# Patient Record
Sex: Female | Born: 1990 | Race: White | Hispanic: No | Marital: Single | Smoking: Former smoker
Health system: Southern US, Community
[De-identification: ages and names within clinical notes are randomized; demographics above are authoritative.]

## PROBLEM LIST (undated history)

## (undated) DIAGNOSIS — E669 Obesity, unspecified: Secondary | ICD-10-CM

## (undated) DIAGNOSIS — O24419 Gestational diabetes mellitus in pregnancy, unspecified control: Secondary | ICD-10-CM

## (undated) HISTORY — DX: Obesity, unspecified: E66.9

---
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
## 2011-01-12 LAB — I-STAT 8, (EC8 V) (CONVERTED LAB)
BUN: 11
Bicarbonate: 23.9
HCT: 43
Hemoglobin: 14.6
Operator id: 257131
Sodium: 139
TCO2: 25
pCO2, Ven: 44.2 — ABNORMAL LOW

## 2011-01-12 LAB — POCT I-STAT CREATININE: Creatinine, Ser: 0.7

## 2013-03-27 DIAGNOSIS — O24419 Gestational diabetes mellitus in pregnancy, unspecified control: Secondary | ICD-10-CM

## 2013-03-27 HISTORY — PX: CHOLECYSTECTOMY: SHX55

## 2013-03-27 HISTORY — DX: Gestational diabetes mellitus in pregnancy, unspecified control: O24.419

## 2013-04-24 ENCOUNTER — Ambulatory Visit: Payer: Self-pay | Admitting: Nurse Practitioner

## 2013-04-27 ENCOUNTER — Ambulatory Visit: Payer: Self-pay | Admitting: Nurse Practitioner

## 2013-06-19 ENCOUNTER — Ambulatory Visit: Payer: Self-pay | Admitting: Nurse Practitioner

## 2013-07-25 ENCOUNTER — Ambulatory Visit: Payer: Self-pay | Admitting: Nurse Practitioner

## 2013-08-08 ENCOUNTER — Emergency Department: Payer: Self-pay | Admitting: Emergency Medicine

## 2013-08-08 LAB — CK TOTAL AND CKMB (NOT AT ARMC)
CK, TOTAL: 90 U/L
CK-MB: 1.7 ng/mL (ref 0.5–3.6)

## 2013-08-08 LAB — COMPREHENSIVE METABOLIC PANEL
ALBUMIN: 2.7 g/dL — AB (ref 3.4–5.0)
ANION GAP: 8 (ref 7–16)
AST: 10 U/L — AB (ref 15–37)
Alkaline Phosphatase: 103 U/L
BILIRUBIN TOTAL: 0.4 mg/dL (ref 0.2–1.0)
BUN: 7 mg/dL (ref 7–18)
CHLORIDE: 105 mmol/L (ref 98–107)
CREATININE: 0.51 mg/dL — AB (ref 0.60–1.30)
Calcium, Total: 8.4 mg/dL — ABNORMAL LOW (ref 8.5–10.1)
Co2: 24 mmol/L (ref 21–32)
Glucose: 140 mg/dL — ABNORMAL HIGH (ref 65–99)
OSMOLALITY: 274 (ref 275–301)
Potassium: 3.4 mmol/L — ABNORMAL LOW (ref 3.5–5.1)
SGPT (ALT): 14 U/L (ref 12–78)
Sodium: 137 mmol/L (ref 136–145)
TOTAL PROTEIN: 6.8 g/dL (ref 6.4–8.2)

## 2013-08-08 LAB — CBC
HCT: 31.8 % — ABNORMAL LOW (ref 35.0–47.0)
HGB: 10.9 g/dL — ABNORMAL LOW (ref 12.0–16.0)
MCH: 31.2 pg (ref 26.0–34.0)
MCHC: 34.2 g/dL (ref 32.0–36.0)
MCV: 91 fL (ref 80–100)
PLATELETS: 182 10*3/uL (ref 150–440)
RBC: 3.49 10*6/uL — ABNORMAL LOW (ref 3.80–5.20)
RDW: 14.1 % (ref 11.5–14.5)
WBC: 11 10*3/uL (ref 3.6–11.0)

## 2013-08-08 LAB — TROPONIN I: Troponin-I: <0.02 ng/mL

## 2013-08-08 LAB — TSH: Thyroid Stimulating Horm: 1.25 u[IU]/mL

## 2013-08-25 ENCOUNTER — Ambulatory Visit: Payer: Self-pay | Admitting: Nurse Practitioner

## 2013-09-05 ENCOUNTER — Observation Stay: Payer: Self-pay | Admitting: Obstetrics & Gynecology

## 2013-09-05 LAB — URINALYSIS, COMPLETE
BACTERIA: NONE SEEN
Bilirubin,UR: NEGATIVE
Blood: NEGATIVE
GLUCOSE, UR: NEGATIVE mg/dL (ref 0–75)
Hyaline Cast: 2
KETONE: NEGATIVE
LEUKOCYTE ESTERASE: NEGATIVE
Nitrite: NEGATIVE
PH: 6 (ref 4.5–8.0)
PROTEIN: NEGATIVE
Specific Gravity: 1.015 (ref 1.003–1.030)

## 2013-09-12 ENCOUNTER — Observation Stay: Payer: Self-pay | Admitting: Obstetrics and Gynecology

## 2013-09-19 ENCOUNTER — Observation Stay: Payer: Self-pay | Admitting: Obstetrics and Gynecology

## 2013-09-30 ENCOUNTER — Observation Stay: Payer: Self-pay | Admitting: Obstetrics and Gynecology

## 2013-09-30 LAB — PIH PROFILE
Anion Gap: 7 (ref 7–16)
BUN: 4 mg/dL — AB (ref 7–18)
CALCIUM: 9 mg/dL (ref 8.5–10.1)
CHLORIDE: 106 mmol/L (ref 98–107)
CREATININE: 0.74 mg/dL (ref 0.60–1.30)
Co2: 26 mmol/L (ref 21–32)
EGFR (African American): >60
GLUCOSE: 74 mg/dL (ref 65–99)
HCT: 31.6 % — AB (ref 35.0–47.0)
HGB: 11.1 g/dL — ABNORMAL LOW (ref 12.0–16.0)
MCH: 31.6 pg (ref 26.0–34.0)
MCHC: 35 g/dL (ref 32.0–36.0)
MCV: 91 fL (ref 80–100)
Osmolality: 273 (ref 275–301)
PLATELETS: 122 10*3/uL — AB (ref 150–440)
Potassium: 3.8 mmol/L (ref 3.5–5.1)
RBC: 3.5 10*6/uL — ABNORMAL LOW (ref 3.80–5.20)
RDW: 14.5 % (ref 11.5–14.5)
SGOT(AST): 24 U/L (ref 15–37)
Sodium: 139 mmol/L (ref 136–145)
Uric Acid: 5.3 mg/dL (ref 2.6–6.0)
WBC: 9.6 10*3/uL (ref 3.6–11.0)

## 2013-09-30 LAB — PROTEIN / CREATININE RATIO, URINE
Creatinine, Urine: 26.3 mg/dL — ABNORMAL LOW (ref 30.0–125.0)
PROTEIN, RANDOM URINE: 6 mg/dL (ref 0–12)
Protein/Creat. Ratio: 228 mg/gCREAT — ABNORMAL HIGH (ref 0–200)

## 2013-10-02 ENCOUNTER — Observation Stay: Payer: Self-pay | Admitting: Obstetrics and Gynecology

## 2013-10-02 LAB — PROTEIN / CREATININE RATIO, URINE
CREATININE, URINE: 46.7 mg/dL (ref 30.0–125.0)
PROTEIN, RANDOM URINE: 19 mg/dL — AB (ref 0–12)
Protein/Creat. Ratio: 407 mg/gCREAT — ABNORMAL HIGH (ref 0–200)

## 2013-10-03 ENCOUNTER — Inpatient Hospital Stay: Payer: Self-pay

## 2013-10-03 LAB — CBC WITH DIFFERENTIAL/PLATELET
BASOS ABS: 0.1 10*3/uL (ref 0.0–0.1)
Basophil %: 0.3 %
EOS ABS: 0 10*3/uL (ref 0.0–0.7)
EOS PCT: 0.1 %
HCT: 32.1 % — ABNORMAL LOW (ref 35.0–47.0)
HGB: 10.9 g/dL — ABNORMAL LOW (ref 12.0–16.0)
LYMPHS PCT: 7.1 %
Lymphocyte #: 1.3 10*3/uL (ref 1.0–3.6)
MCH: 30.9 pg (ref 26.0–34.0)
MCHC: 34 g/dL (ref 32.0–36.0)
MCV: 91 fL (ref 80–100)
MONOS PCT: 7.8 %
Monocyte #: 1.4 x10 3/mm — ABNORMAL HIGH (ref 0.2–0.9)
NEUTROS ABS: 15 10*3/uL — AB (ref 1.4–6.5)
NEUTROS PCT: 84.7 %
PLATELETS: 143 10*3/uL — AB (ref 150–440)
RBC: 3.54 10*6/uL — AB (ref 3.80–5.20)
RDW: 14.8 % — ABNORMAL HIGH (ref 11.5–14.5)
WBC: 17.7 10*3/uL — ABNORMAL HIGH (ref 3.6–11.0)

## 2013-10-04 LAB — HEMATOCRIT: HCT: 26.7 % — ABNORMAL LOW (ref 35.0–47.0)

## 2013-10-11 ENCOUNTER — Emergency Department: Payer: Self-pay | Admitting: Emergency Medicine

## 2013-10-11 LAB — COMPREHENSIVE METABOLIC PANEL
ALBUMIN: 2.9 g/dL — AB (ref 3.4–5.0)
ALK PHOS: 134 U/L — AB
ALT: 26 U/L (ref 12–78)
ANION GAP: 11 (ref 7–16)
BILIRUBIN TOTAL: 0.6 mg/dL (ref 0.2–1.0)
BUN: 9 mg/dL (ref 7–18)
CHLORIDE: 105 mmol/L (ref 98–107)
CREATININE: 0.96 mg/dL (ref 0.60–1.30)
Calcium, Total: 8.9 mg/dL (ref 8.5–10.1)
Co2: 22 mmol/L (ref 21–32)
EGFR (Non-African Amer.): >60
Glucose: 125 mg/dL — ABNORMAL HIGH (ref 65–99)
Osmolality: 276 (ref 275–301)
POTASSIUM: 3.7 mmol/L (ref 3.5–5.1)
SGOT(AST): 24 U/L (ref 15–37)
SODIUM: 138 mmol/L (ref 136–145)
Total Protein: 7.4 g/dL (ref 6.4–8.2)

## 2013-10-11 LAB — URINALYSIS, COMPLETE
BILIRUBIN, UR: NEGATIVE
GLUCOSE, UR: NEGATIVE mg/dL (ref 0–75)
Ketone: NEGATIVE
Nitrite: NEGATIVE
PH: 6 (ref 4.5–8.0)
Protein: 30
Specific Gravity: 1.005 (ref 1.003–1.030)
Squamous Epithelial: 10
WBC UR: 262 /HPF (ref 0–5)

## 2013-10-11 LAB — CBC WITH DIFFERENTIAL/PLATELET
Basophil #: 0.1 10*3/uL (ref 0.0–0.1)
Basophil %: 0.5 %
EOS ABS: 0.1 10*3/uL (ref 0.0–0.7)
Eosinophil %: 1 %
HCT: 31.4 % — ABNORMAL LOW (ref 35.0–47.0)
HGB: 10.5 g/dL — AB (ref 12.0–16.0)
Lymphocyte #: 1.5 10*3/uL (ref 1.0–3.6)
Lymphocyte %: 12.2 %
MCH: 30.7 pg (ref 26.0–34.0)
MCHC: 33.4 g/dL (ref 32.0–36.0)
MCV: 92 fL (ref 80–100)
MONO ABS: 0.7 x10 3/mm (ref 0.2–0.9)
Monocyte %: 5.9 %
NEUTROS PCT: 80.4 %
Neutrophil #: 10 10*3/uL — ABNORMAL HIGH (ref 1.4–6.5)
PLATELETS: 266 10*3/uL (ref 150–440)
RBC: 3.42 10*6/uL — AB (ref 3.80–5.20)
RDW: 15 % — ABNORMAL HIGH (ref 11.5–14.5)
WBC: 12.4 10*3/uL — ABNORMAL HIGH (ref 3.6–11.0)

## 2013-10-11 LAB — WET PREP, GENITAL

## 2013-10-11 LAB — LIPASE, BLOOD: LIPASE: 75 U/L (ref 73–393)

## 2013-10-11 LAB — GC/CHLAMYDIA PROBE AMP

## 2013-10-11 LAB — HCG, QUANTITATIVE, PREGNANCY: BETA HCG, QUANT.: 109 m[IU]/mL — AB

## 2013-10-13 ENCOUNTER — Inpatient Hospital Stay: Payer: Self-pay | Admitting: Obstetrics and Gynecology

## 2013-10-13 LAB — COMPREHENSIVE METABOLIC PANEL
ALBUMIN: 2.8 g/dL — AB (ref 3.4–5.0)
ALK PHOS: 129 U/L — AB
Anion Gap: 9 (ref 7–16)
BUN: 9 mg/dL (ref 7–18)
Bilirubin,Total: 0.7 mg/dL (ref 0.2–1.0)
CHLORIDE: 104 mmol/L (ref 98–107)
CO2: 25 mmol/L (ref 21–32)
Calcium, Total: 8.6 mg/dL (ref 8.5–10.1)
Creatinine: 0.86 mg/dL (ref 0.60–1.30)
EGFR (African American): >60
EGFR (Non-African Amer.): >60
GLUCOSE: 96 mg/dL (ref 65–99)
Osmolality: 274 (ref 275–301)
Potassium: 3.5 mmol/L (ref 3.5–5.1)
SGOT(AST): 19 U/L (ref 15–37)
SGPT (ALT): 20 U/L (ref 12–78)
Sodium: 138 mmol/L (ref 136–145)
Total Protein: 7.3 g/dL (ref 6.4–8.2)

## 2013-10-13 LAB — CBC WITH DIFFERENTIAL/PLATELET
BASOS PCT: 0.2 %
Basophil #: 0 10*3/uL (ref 0.0–0.1)
EOS ABS: 0 10*3/uL (ref 0.0–0.7)
Eosinophil %: 0.1 %
HCT: 29.8 % — ABNORMAL LOW (ref 35.0–47.0)
HGB: 10.2 g/dL — AB (ref 12.0–16.0)
LYMPHS ABS: 1.2 10*3/uL (ref 1.0–3.6)
Lymphocyte %: 7.3 %
MCH: 31 pg (ref 26.0–34.0)
MCHC: 34.2 g/dL (ref 32.0–36.0)
MCV: 91 fL (ref 80–100)
MONO ABS: 1.2 x10 3/mm — AB (ref 0.2–0.9)
MONOS PCT: 7.7 %
NEUTROS ABS: 13.6 10*3/uL — AB (ref 1.4–6.5)
NEUTROS PCT: 84.7 %
Platelet: 229 10*3/uL (ref 150–440)
RBC: 3.28 10*6/uL — AB (ref 3.80–5.20)
RDW: 14.7 % — AB (ref 11.5–14.5)
WBC: 16.1 10*3/uL — AB (ref 3.6–11.0)

## 2013-10-14 LAB — URINE CULTURE

## 2013-10-16 LAB — CBC WITH DIFFERENTIAL/PLATELET
Basophil #: 0 10*3/uL (ref 0.0–0.1)
Basophil %: 0.3 %
Eosinophil #: 0.1 10*3/uL (ref 0.0–0.7)
Eosinophil %: 1.4 %
HCT: 28.3 % — AB (ref 35.0–47.0)
HGB: 9.6 g/dL — ABNORMAL LOW (ref 12.0–16.0)
LYMPHS ABS: 1.8 10*3/uL (ref 1.0–3.6)
LYMPHS PCT: 24.2 %
MCH: 30.7 pg (ref 26.0–34.0)
MCHC: 33.8 g/dL (ref 32.0–36.0)
MCV: 91 fL (ref 80–100)
MONO ABS: 0.6 x10 3/mm (ref 0.2–0.9)
MONOS PCT: 8.2 %
NEUTROS ABS: 4.8 10*3/uL (ref 1.4–6.5)
NEUTROS PCT: 65.9 %
Platelet: 273 10*3/uL (ref 150–440)
RBC: 3.12 10*6/uL — ABNORMAL LOW (ref 3.80–5.20)
RDW: 14.2 % (ref 11.5–14.5)
WBC: 7.3 10*3/uL (ref 3.6–11.0)

## 2013-10-16 LAB — URINALYSIS, COMPLETE
BILIRUBIN, UR: NEGATIVE
GLUCOSE, UR: NEGATIVE mg/dL (ref 0–75)
Ketone: NEGATIVE
NITRITE: NEGATIVE
Ph: 6 (ref 4.5–8.0)
Protein: NEGATIVE
Specific Gravity: 1.003 (ref 1.003–1.030)
Squamous Epithelial: 1

## 2013-10-18 LAB — CULTURE, BLOOD (SINGLE)

## 2013-10-25 ENCOUNTER — Emergency Department: Payer: Self-pay | Admitting: Emergency Medicine

## 2013-10-25 LAB — URINALYSIS, COMPLETE
Bacteria: NONE SEEN
Bilirubin,UR: NEGATIVE
Glucose,UR: NEGATIVE mg/dL (ref 0–75)
Ketone: NEGATIVE
NITRITE: NEGATIVE
PH: 5 (ref 4.5–8.0)
PROTEIN: NEGATIVE
Specific Gravity: 1.016 (ref 1.003–1.030)

## 2013-10-25 LAB — COMPREHENSIVE METABOLIC PANEL
ALBUMIN: 3.5 g/dL (ref 3.4–5.0)
ALT: 23 U/L
Alkaline Phosphatase: 132 U/L — ABNORMAL HIGH
Anion Gap: 9 (ref 7–16)
BILIRUBIN TOTAL: 0.4 mg/dL (ref 0.2–1.0)
BUN: 15 mg/dL (ref 7–18)
CALCIUM: 9.1 mg/dL (ref 8.5–10.1)
CHLORIDE: 105 mmol/L (ref 98–107)
CREATININE: 1.08 mg/dL (ref 0.60–1.30)
Co2: 26 mmol/L (ref 21–32)
EGFR (African American): >60
GLUCOSE: 104 mg/dL — AB (ref 65–99)
OSMOLALITY: 281 (ref 275–301)
POTASSIUM: 4.6 mmol/L (ref 3.5–5.1)
SGOT(AST): 16 U/L (ref 15–37)
SODIUM: 140 mmol/L (ref 136–145)
Total Protein: 7.7 g/dL (ref 6.4–8.2)

## 2013-10-25 LAB — CBC
HCT: 33.3 % — AB (ref 35.0–47.0)
HGB: 10.9 g/dL — ABNORMAL LOW (ref 12.0–16.0)
MCH: 29.8 pg (ref 26.0–34.0)
MCHC: 32.8 g/dL (ref 32.0–36.0)
MCV: 91 fL (ref 80–100)
Platelet: 319 10*3/uL (ref 150–440)
RBC: 3.67 10*6/uL — ABNORMAL LOW (ref 3.80–5.20)
RDW: 14.7 % — AB (ref 11.5–14.5)
WBC: 8.4 10*3/uL (ref 3.6–11.0)

## 2013-10-25 LAB — TROPONIN I

## 2013-10-25 LAB — PRO B NATRIURETIC PEPTIDE: B-Type Natriuretic Peptide: 34 pg/mL (ref 0–125)

## 2013-10-25 LAB — LIPASE, BLOOD: Lipase: 123 U/L (ref 73–393)

## 2013-10-28 LAB — PATHOLOGY REPORT

## 2013-11-18 ENCOUNTER — Ambulatory Visit: Payer: Self-pay | Admitting: Surgery

## 2013-11-20 LAB — PATHOLOGY REPORT

## 2014-07-18 NOTE — Op Note (Signed)
PATIENT NAME:  Meagan ErichsenCHAMPION, Meagan MR#:  [id number] DATE OF BIRTH:  1990-11-13  DATE OF PROCEDURE:  11/18/2013  PREOPERATIVE DIAGNOSIS: Chronic cholecystitis and cholelithiasis.   POSTOPERATIVE DIAGNOSIS: Chronic cholecystitis and cholelithiasis.   OPERATION: Robotic-assisted laparoscopic cholecystectomy.   SURGEON: Quentin Orealph L. Ely III, MD.   ANESTHESIA: General.   OPERATIVE PROCEDURE: With the patient in the supine position after induction of appropriate general anesthesia, the patient's abdomen was prepped with ChloraPrep and draped with sterile towels. The patient was placed in the head down, feet up position. A small infraumbilical incision was made in the standard fashion and carried down bluntly through the subcutaneous tissue. A Veress needle could not reach the abdominal cavity. Using an Optiview bariatric catheter under direct vision, the abdomen was cannulated without difficulty. CO2 was insufflated. Two lateral ports, robotic size, were placed under direct vision, and then a 5 mm port was placed in the right upper quadrant. The patient was placed in the appropriate position. The robot brought to the table, docked to the patient, instruments inserted under direct vision. I then moved to the console. The gallbladder was grasped and retracted superiorly and laterally, exposing the hepatoduodenal ligament. The cystic duct and common duct were easily identified. A large lymph node was visualized along the cystic duct. Dissection was carried out along the hepatoduodenal ligament exposing the cystic duct. Once satisfactory exposure was obtained, the duct was doubly clipped and divided. The cystic artery was behind the duct, visualized clean, then doubly clipped and divided. There was a small bleeding source on top of the artery which was cauterized. The gallbladder was then dissected free of its bed and liver using hook and cautery apparatus. Once the gallbladder was free, the area was suction irrigated.  There did not appear to be any significant bleeding sites anywhere in the bed of the liver. The gallbladder was captured with a grasper and removed through the umbilical port. The area was then desufflated. All ports were withdrawn without difficulty. The area was infiltrated with 0.25% Marcaine for postoperative pain control. Sterile dressings were applied. The patient was returned to the recovery room, having tolerated the procedure well. Sponge and needle counts were correct x 2 in the operating room.    ____________________________ Carmie Endalph L. Ely III, MD rle:at D: 11/18/2013 09:13:29 ET T: 11/18/2013 15:00:50 ET JOB#: [id number]  cc: Quentin Orealph L. Ely III, MD, <Dictator> Quentin OreALPH L ELY MD ELECTRONICALLY SIGNED 11/20/2013 17:28

## 2014-08-04 NOTE — H&P (Signed)
L&D Evaluation:  History Expanded:  HPI [age] yo G2P1 twins at 3165w1d [facility]. No vaginal bleeding or Ctxs. positive fetal movement x 2. Presents for scheduled NST of di/di twin pregnancy   Gravida 2   Term 1   PreTerm 0   Abortion 0   Living 1   Blood Type (Maternal) O positive   Group B Strep Results Maternal (Result >5wks must be treated as unknown) unknown/result > 5 weeks ago   Maternal HIV Negative   Maternal Syphilis Ab Nonreactive   Maternal Varicella Immune   Rubella Results (Maternal) immune   Maternal T-Dap Unknown   [facility]   Patient's Medical History No Chronic Illness   Patient's Surgical History none   Medications Pre Natal Vitamins   Allergies PCN   Social History none   Family History Non-Contributory   Current Prenatal Course Notable For Multiple Gestation   ROS:  ROS All systems were reviewed.  HEENT, CNS, GI, GU, Respiratory, CV, Renal and Musculoskeletal systems were found to be normal.   Exam:  Vital Signs normotensive, all other vss stable   General no apparent distress   Abdomen gravid, non-tender   FHT normal rate with no decels   FHT Description Baby A: 140/mod var/+accels/no decels, Baby B: 145/mod var/+accels/no decels   Ucx absent   Impression:  Impression 1) Intrauterine pregnancy with di/di twins at 1965w1d gestational age , 2) Reactive NST   Plan:  Plan EFM/NST   Comments twin pregnancy   Follow Up Appointment need to schedule. in 1 week   Electronic Signatures: Conard NovakJackson, Stephen D (MD)  (Signed 26-Jun-15 17:40)  Authored: L&D Evaluation   Last Updated: 26-Jun-15 17:40 by Conard NovakJackson, Stephen D (MD)

## 2014-08-04 NOTE — H&P (Signed)
L&D Evaluation:  History Expanded:  HPI 24 yo G2P1 twins at 937w3d [facility]. No vaginal bleeding or Ctxs.   Gravida 2   Term 1   PreTerm 0   Abortion 0   Living 11   Blood Type (Maternal) pending   Group B Strep Results Maternal (Result >5wks must be treated as unknown) unknown/result > 5 weeks ago   Maternal HIV Unknown   Maternal Syphilis Ab Unknown   Maternal Varicella Unknown   Rubella Results (Maternal) unknown   Maternal T-Dap Unknown   [facility]   Presents with abdominal pain   Patient's Medical History No Chronic Illness   Patient's Surgical History none   Medications Pre Natal Vitamins   Allergies PCN   Social History none   Family History Non-Contributory   Current Prenatal Course Notable For Multiple Gestation   ROS:  ROS All systems were reviewed.  HEENT, CNS, GI, GU, Respiratory, CV, Renal and Musculoskeletal systems were found to be normal.   Exam:  Vital Signs stable   General no apparent distress   Mental Status clear   Chest clear   Heart normal sinus rhythm   Abdomen gravid, non-tender   Estimated Fetal Weight Average for gestational age   Back no CVAT   Edema no edema   Pelvic no external lesions, cervix closed and thick   Mebranes Intact   FHT normal rate with no decels, x2, Cat 1 x 2   Ucx irregular, absent   Skin dry   Lymph no lymphadenopathy   Impression:  Impression UTI, Dehydration, pain.   Plan:  Plan UA, EFM/NST, monitor contractions and for cervical change   Comments twin pregnancy   Follow Up Appointment need to schedule. in 1 week   Electronic Signatures: Adria DevonKlett, Carrie (MD)  (Signed 19-Jun-15 23:28)  Authored: L&D Evaluation   Last Updated: 19-Jun-15 23:28 by Adria DevonKlett, Carrie (MD)

## 2014-08-04 NOTE — H&P (Signed)
L&D Evaluation:  History Expanded:  HPI 24 yo G2P1 twins at 36w 0days [facility]. Presents with regular uterine contractions. No vaginal bleeding. positive fetal movement x 2. Had some elevetaed BP a couple of days ago and continues to have some today.  Denies headache, visual disturbances, and RUQ pain.   Pregnancy complicated by obesity (BMI 38 initially).   Gravida 2   Term 1   PreTerm 0   Abortion 0   Living 1   Blood Type (Maternal) O positive   Group B Strep Results Maternal (Result >5wks must be treated as unknown) negative   Maternal HIV Negative   Maternal Syphilis Ab Nonreactive   Maternal Varicella Immune   Rubella Results (Maternal) immune   Maternal T-Dap Unknown   [facility]   Patient's Medical History No Chronic Illness   Patient's Surgical History none   Medications Pre Natal Vitamins   Allergies PCN   Social History none   Family History Non-Contributory   Current Prenatal Course Notable For Multiple Gestation   ROS:  ROS All systems were reviewed.  HEENT, CNS, GI, GU, Respiratory, CV, Renal and Musculoskeletal systems were found to be normal.   Exam:  Vital Signs BP >140/90  BP 119-156/65-92, P90s   General no apparent distress   Mental Status clear   Chest clear   Heart normal sinus rhythm   Abdomen gravid, non-tender   Estimated Fetal Weight Large for gestational age, both twins appear appropriatel grown   Fetal Position cephalic (A), transverse (B)   Back no CVAT   Edema 1+  mild pitting on pannus   Reflexes 1+   Pelvic no external lesions, 3cm x 3 checks   Mebranes Intact   FHT normal rate with no decels, cat 1 x  2   FHT Description Baby A: 140/mod var/+accels/no decels, Baby B: 145/mod var/+accels/no decels   Ucx irregular, 2-3 q 10 min   Skin dry   Lymph no lymphadenopathy   Impression:  Impression early labor, evaluation for PIH, 1) Intrauterine pregnancy with  di/di twins at 36w 0 day  gestational age , 2) preeclampsia without severe features, 3) preterm contractions   Plan:  Plan EFM/NST   Comments 1) Monitor for cervical change 2) Discussed that if she does labor, will offer vaginal delivery unless there appears to be distress of one of the twins.  Discussed possibility of vaginal delivery and need for cesarean delivery in addition to vaginal delivery.  Patient understands risks.  Will continue to discuss.   3) a single dose of terbutaline to see if she has relief of symptoms 4) I have discussed with one of my associates, Dr Janene HarveyKlett.  She will come in to assist  with vaginal delivery of twins given non-vertex presentation of baby B. 5) Mild preeclampsia: plan for delivery at 37 weeks unless labors 6) Fetal well being is reassuring x 2 with category 1 tracings   Follow Up Appointment already scheduled. in 1 week   Labs:  Lab Results:  Misc Urine Chem:  09-Jul-15 20:28   Protein/Creat Ratio (comp)  407 (Result(s) reported on 02 Oct 2013 at 10:21PM.)   Electronic Signatures: Conard NovakJackson, Avi Archuleta D (MD)  (Signed 09-Jul-15 23:50)  Authored: L&D Evaluation, Labs   Last Updated: 09-Jul-15 23:50 by Conard NovakJackson, Jessenia Filippone D (MD)

## 2014-08-04 NOTE — H&P (Signed)
L&D Evaluation:  History Expanded:  HPI 24 yo G2P1 twins at 35w 5days [facility]. No vaginal bleeding or Ctxs. positive fetal movement x 2. Presents for scheduled NST of di/di twin pregnancy  also has some elevetaed BP today and is with some mild pitting edema as well as som eelevted pressures, to 144/76 no other PIH symptoms,   Gravida 2   Term 1   PreTerm 0   Abortion 0   Living 1   Blood Type (Maternal) O positive   Group B Strep Results Maternal (Result >5wks must be treated as unknown) negative   Maternal HIV Negative   Maternal Syphilis Ab Nonreactive   Maternal Varicella Immune   Rubella Results (Maternal) immune   Maternal T-Dap Unknown   [facility]   Patient's Medical History No Chronic Illness   Patient's Surgical History none   Medications Pre Natal Vitamins   Allergies PCN   Social History none   Family History Non-Contributory   Current Prenatal Course Notable For Multiple Gestation   ROS:  ROS All systems were reviewed.  HEENT, CNS, GI, GU, Respiratory, CV, Renal and Musculoskeletal systems were found to be normal.   Exam:  Vital Signs BP >140/90  one   General no apparent distress   Mental Status clear   Chest clear   Heart normal sinus rhythm   Abdomen gravid, non-tender   Estimated Fetal Weight Large for gestational age, but twins   Back no CVAT   Edema 1+   Reflexes 1+   Pelvic no external lesions   Mebranes Intact   FHT normal rate with no decels, cat 1 x  2   FHT Description Baby A: 140/mod var/+accels/no decels, Baby B: 145/mod var/+accels/no decels   Ucx irregular   Skin dry   Lymph no lymphadenopathy   Impression:  Impression early labor, evaluation for PIH, 1) Intrauterine pregnancy with di/di twins at 6735w 5 day  gestational age , 2) Reactive NST   Plan:  Plan EFM/NST   Comments twin pregnancy   Follow Up Appointment need to schedule. in 1 week. NST labor  precaiutions given   Electronic Signatures: Adria DevonKlett, Cleven Jansma (MD)  (Signed 07-Jul-15 11:22)  Authored: L&D Evaluation   Last Updated: 07-Jul-15 11:22 by Adria DevonKlett, Kallee Nam (MD)

## 2014-08-04 NOTE — H&P (Signed)
L&D Evaluation:  History Expanded:  HPI 24 yo G2P1 twins at 36w 1days [facility]. Presents with regular uterine contractions. No vaginal bleeding. positive fetal movement x 2. Denies headache, visual disturbances, and RUQ pain.  Elevated BPs intermittantly of late. Pregnancy complicated by obesity (BMI 38 initially).   Gravida 2   Term 1   PreTerm 0   Abortion 0   Living 1   Blood Type (Maternal) O positive   Group B Strep Results Maternal (Result >5wks must be treated as unknown) negative   Maternal HIV Negative   Maternal Syphilis Ab Nonreactive   Maternal Varicella Immune   Rubella Results (Maternal) immune   Maternal T-Dap Unknown   [facility]   Patient's Medical History No Chronic Illness   Patient's Surgical History none   Medications Pre Natal Vitamins   Allergies PCN   Social History none   Family History Non-Contributory   Current Prenatal Course Notable For Multiple Gestation   ROS:  ROS All systems were reviewed.  HEENT, CNS, GI, GU, Respiratory, CV, Renal and Musculoskeletal systems were found to be normal.   Exam:  Vital Signs BP >140/90   General no apparent distress   Mental Status clear   Chest clear   Heart normal sinus rhythm   Abdomen gravid, non-tender   Estimated Fetal Weight Average for gestational age, both twins appear appropriatel grown   Fetal Position cephalic (A), transverse (B)   Back no CVAT   Edema 1+  mild pitting on pannus   Reflexes 1+   Pelvic no external lesions, 7/90/0, BBOW   Mebranes Intact   FHT normal rate with no decels, cat 1   Ucx regular   Ucx Frequency 3 min   Skin dry   Impression:  Impression active labor, 1) Intrauterine pregnancy with di/di twins at 8836w 1 day  gestational age , 2) preterm contractions   Plan:  Plan EFM/NST, monitor contractions and for cervical change, monitor BP, fluids   Comments 1) Monitor for cervical change 2)  Vaginal delivery an option unless there appears to be distress of one of the twins.  Discussed possibility of vaginal delivery and need for cesarean delivery in addition to vaginal delivery.  Patient understands risks.  Will continue to discuss.   3) Fetal well being is reassuring x 2 with category 1 tracings   Electronic Signatures: Letitia LibraHarris, Kazuto Sevey Paul (MD)  (Signed 10-Jul-15 20:41)  Authored: L&D Evaluation   Last Updated: 10-Jul-15 20:41 by Letitia LibraHarris, Jaskirat Schwieger Paul (MD)

## 2014-08-04 NOTE — H&P (Signed)
L&D Evaluation:  History Expanded:  HPI 24 yo G2P1 twins at 32 weeks w LBP and crampiness. [facility]. No vaginal bleeding or Ctxs.  Pain is low back and worse w activity.   Patient's Medical History No Chronic Illness   Patient's Surgical History none   Medications Pre Natal Vitamins   Allergies PCN   Social History none   Family History Non-Contributory   ROS:  ROS All systems were reviewed.  HEENT, CNS, GI, GU, Respiratory, CV, Renal and Musculoskeletal systems were found to be normal.   Exam:  Vital Signs stable   General no apparent distress   Mental Status clear   Chest clear   Heart normal sinus rhythm   Abdomen gravid, non-tender   Estimated Fetal Weight Average for gestational age   Back no CVAT   Edema no edema   Pelvic no external lesions, cervix closed and thick   Mebranes Intact   FHT normal rate with no decels, x2   Ucx absent   Impression:  Impression UTI, Dehydration, pain.   Plan:  Plan UA, EFM/NST, monitor contractions and for cervical change   Comments fetal fibronectin obtained bu w no CTxs or dilating cervix will not send.   Electronic Signatures: Letitia LibraHarris, Robert Paul (MD)  (Signed 12-Jun-15 13:57)  Authored: L&D Evaluation   Last Updated: 12-Jun-15 13:57 by Letitia LibraHarris, Robert Paul (MD)

## 2016-04-08 ENCOUNTER — Encounter: Payer: Self-pay | Admitting: Emergency Medicine

## 2016-04-08 ENCOUNTER — Emergency Department
Admission: EM | Admit: 2016-04-08 | Discharge: 2016-04-08 | Disposition: A | Discharge status: Home / Self Care | Payer: Self-pay | Attending: Emergency Medicine | Admitting: Emergency Medicine

## 2016-04-08 DIAGNOSIS — B9789 Other viral agents as the cause of diseases classified elsewhere: Secondary | ICD-10-CM

## 2016-04-08 DIAGNOSIS — J069 Acute upper respiratory infection, unspecified: Secondary | ICD-10-CM | POA: Insufficient documentation

## 2016-04-08 MED ORDER — FLUTICASONE PROPIONATE 50 MCG/ACT NA SUSP: 1.0000 | Freq: Two times a day (BID) | NASAL | 0 refills | Status: DC

## 2016-04-08 MED ORDER — PSEUDOEPH-BROMPHEN-DM 30-2-10 MG/5ML PO SYRP: 10.0000 mL | ORAL_SOLUTION | Freq: Four times a day (QID) | ORAL | 0 refills | Status: DC | PRN

## 2016-04-08 MED ORDER — CETIRIZINE HCL 10 MG PO TABS: 10.0000 mg | ORAL_TABLET | Freq: Every day | ORAL | 0 refills | Status: DC

## 2016-04-08 NOTE — ED Provider Notes (Signed)
[facility] Emergency Department Provider Note  ____________________________________________  Time seen: Approximately 3:03 PM  I have reviewed the triage vital signs and the nursing notes.   HISTORY  Chief Complaint Nasal Congestion and Fever    HPI Meagan Armstrong is a 26 y.o. female who presents emergency department complaining of nasal congestion, sore throat, coughing, fever. Per the patient, symptoms began gradually. Patient started off nasal congestion then developed sore throat and coughing. Today patient started running a fever. Patient states that she has young children at home and just wants to make sure she is not contagious. Patient has tried pseudoephedrine and Alka-Seltzer with some relief of symptoms. Patient denies any headache, visual changes, neck pain, chest pain, shortness of breath, abdominal pain, nausea or vomiting, diarrhea or constipation.   History reviewed. No pertinent past medical history.  There are no active problems to display for this patient.   History reviewed. No pertinent surgical history.  Prior to Admission medications   Medication Sig Start Date End Date Taking? Authorizing Provider  brompheniramine-pseudoephedrine-DM 30-2-10 MG/5ML syrup Take 10 mLs by mouth 4 (four) times daily as needed. 04/08/16   Delorise RoyalsJonathan D Lenoard Helbert, PA-C  cetirizine (ZYRTEC) 10 MG tablet Take 1 tablet (10 mg total) by mouth daily. 04/08/16   Delorise RoyalsJonathan D Brianni Manthe, PA-C  fluticasone (FLONASE) 50 MCG/ACT nasal spray Place 1 spray into both nostrils 2 (two) times daily. 04/08/16   Delorise RoyalsJonathan D Delbert Darley, PA-C    Allergies Amoxicillin and Penicillins  No family history on file.  Social History Social History  Substance Use Topics  . Smoking status: Never Smoker  . Smokeless tobacco: Never Used  . Alcohol use Yes     Comment: occasionally     Review of Systems  Constitutional: Positive fever/chills Eyes: No visual changes. No  discharge ENT: Positive for nasal congestion and sore throat Cardiovascular: no chest pain. Respiratory: Positive cough. No SOB. Gastrointestinal: No abdominal pain.  No nausea, no vomiting.  No diarrhea.  No constipation. Musculoskeletal: Negative for musculoskeletal pain. Skin: Negative for rash, abrasions, lacerations, ecchymosis. Neurological: Negative for headaches, focal weakness or numbness. 10-point ROS otherwise negative.  ____________________________________________   PHYSICAL EXAM:  VITAL SIGNS: ED Triage Vitals [04/08/16 1422]  Enc Vitals Group     BP 136/83     Pulse Rate 98     Resp 18     Temp 99.1 F (37.3 C)     Temp Source Oral     SpO2 97 %     Weight 247 lb (112 kg)     Height 5\' 6"  (1.676 m)     Head Circumference      Peak Flow      Pain Score 5     Pain Loc      Pain Edu?      Excl. in GC?      Constitutional: Alert and oriented. Well appearing and in no acute distress. Eyes: Conjunctivae are normal. PERRL. EOMI. Head: Atraumatic. ENT:      Ears: EACs with cerumen in bilateral canals. TMs are visualized with no duskiness, bulging, air-fluid level identified.      Nose: Moderate clear congestion/rhinnorhea. Parents are erythematous and edematous. Patient is nontender to palpation/percussion over the sinuses.      Mouth/Throat: Mucous membranes are moist. Oropharynx is mildly erythematous but nonedematous. Uvula is midline. Tonsils are mildly erythematous but nonedematous and no exudates. Neck: No stridor.  No cervical spine tenderness to palpation. Neck supple full range of motion  Hematological/Lymphatic/Immunilogical: No cervical lymphadenopathy. Cardiovascular: Normal rate, regular rhythm. Normal S1 and S2.  Good peripheral circulation. Respiratory: Normal respiratory effort without tachypnea or retractions. Lungs CTAB. Good air entry to the bases with no decreased or absent breath sounds. Musculoskeletal: Full range of motion to all extremities.  No gross deformities appreciated. Neurologic:  Normal speech and language. No gross focal neurologic deficits are appreciated.  Skin:  Skin is warm, dry and intact. No rash noted. Psychiatric: Mood and affect are normal. Speech and behavior are normal. Patient exhibits appropriate insight and judgement.   ____________________________________________   LABS (all labs ordered are listed, but only abnormal results are displayed)  Labs Reviewed - No data to display ____________________________________________  EKG   ____________________________________________  RADIOLOGY   No results found.  ____________________________________________    PROCEDURES  Procedure(s) performed:    Procedures    Medications - No data to display   ____________________________________________   INITIAL IMPRESSION / ASSESSMENT AND PLAN / ED COURSE  Pertinent labs & imaging results that were available during my care of the patient were reviewed by me and considered in my medical decision making (see chart for details).  Review of the [place] CSRS was performed in accordance of the [facility] prior to dispensing any controlled drugs.  Clinical Course     Patient's diagnosis is consistent with Viral upper respiratory infection with cough. Patient's symptoms were insidious in 3 days. No indication for flu swab this time. No indication for further imaging or labs at this time.. Patient will be discharged home with prescriptions for symptom control medications include Flonase, Zyrtec, Bromfed cough syrup. Patient is to follow up with primary care as needed or otherwise directed. Patient is given ED precautions to return to the ED for any worsening or new symptoms.     ____________________________________________  FINAL CLINICAL IMPRESSION(S) / ED DIAGNOSES  Final diagnoses:  Viral URI with cough      NEW MEDICATIONS STARTED DURING THIS VISIT:  Discharge Medication List as of 04/08/2016  3:12  PM    START taking these medications   Details  brompheniramine-pseudoephedrine-DM 30-2-10 MG/5ML syrup Take 10 mLs by mouth 4 (four) times daily as needed., Starting Sat 04/08/2016, Print    cetirizine (ZYRTEC) 10 MG tablet Take 1 tablet (10 mg total) by mouth daily., Starting Sat 04/08/2016, Print    fluticasone (FLONASE) 50 MCG/ACT nasal spray Place 1 spray into both nostrils 2 (two) times daily., Starting Sat 04/08/2016, Print            This chart was dictated using voice recognition software/Dragon. Despite best efforts to proofread, errors can occur which can change the meaning. Any change was purely unintentional.    Racheal Patches, PA-C 04/08/16 1536    Emily Filbert, MD 04/08/16 2249

## 2016-04-08 NOTE — ED Triage Notes (Signed)
Patient presents to the ED with nasal congestion, cough and fever x 3 days.  Patient is speaking in full sentences without difficulty and was ambulatory to triage.  Patient denies vomiting and diarrhea.

## 2018-05-14 ENCOUNTER — Encounter: Payer: Self-pay | Admitting: Obstetrics and Gynecology

## 2018-05-14 ENCOUNTER — Ambulatory Visit (INDEPENDENT_AMBULATORY_CARE_PROVIDER_SITE_OTHER): Payer: Medicaid Other | Admitting: Obstetrics and Gynecology

## 2018-05-14 ENCOUNTER — Other Ambulatory Visit (HOSPITAL_COMMUNITY)
Admission: RE | Admit: 2018-05-14 | Discharge: 2018-05-14 | Disposition: A | Discharge status: Home / Self Care | Payer: Self-pay | Source: Ambulatory Visit | Attending: Obstetrics and Gynecology | Admitting: Obstetrics and Gynecology

## 2018-05-14 ENCOUNTER — Ambulatory Visit: Payer: Self-pay | Admitting: Obstetrics and Gynecology

## 2018-05-14 VITALS — BP 122/62 | HR 98 | Ht 66.0 in | Wt 242.0 lb

## 2018-05-14 DIAGNOSIS — Z124 Encounter for screening for malignant neoplasm of cervix: Secondary | ICD-10-CM

## 2018-05-14 DIAGNOSIS — Z3049 Encounter for surveillance of other contraceptives: Secondary | ICD-10-CM | POA: Diagnosis not present

## 2018-05-14 DIAGNOSIS — Z30011 Encounter for initial prescription of contraceptive pills: Secondary | ICD-10-CM

## 2018-05-14 DIAGNOSIS — Z3046 Encounter for surveillance of implantable subdermal contraceptive: Secondary | ICD-10-CM

## 2018-05-14 DIAGNOSIS — Z01419 Encounter for gynecological examination (general) (routine) without abnormal findings: Secondary | ICD-10-CM

## 2018-05-14 DIAGNOSIS — Z113 Encounter for screening for infections with a predominantly sexual mode of transmission: Secondary | ICD-10-CM

## 2018-05-14 DIAGNOSIS — Z0000 Encounter for general adult medical examination without abnormal findings: Secondary | ICD-10-CM | POA: Diagnosis not present

## 2018-05-14 MED ORDER — NORETHIN ACE-ETH ESTRAD-FE 1-20 MG-MCG(24) PO TABS: 1.0000 | ORAL_TABLET | Freq: Every day | ORAL | 3 refills | Status: DC

## 2018-05-14 NOTE — Patient Instructions (Addendum)
I value your feedback and entrusting us with your care. If you get a [facility] patient survey, I would appreciate you taking the time to let us know about your experience today. Thank you!  Remove the dressing in 24 hours,  keep the incision area dry for 24 hours and remove the Steristrip in 2-3  days.  Notify us if any signs of tenderness, redness, pain, or fevers develop.  

## 2018-05-14 NOTE — Progress Notes (Signed)
PCP:  Patient, No Pcp Per   Chief Complaint  Patient presents with  . Gynecologic Exam  . Contraception    nexplanon removal, wants pills     HPI:      Ms. Meagan Armstrong is a 28 y.o. No obstetric history on file. who LMP was Patient's last menstrual period was 04/15/2018 (approximate)., presents today for her NP> 3 yrs annual examination.  Her menses are irregular with nexplanon, lasting 3- 6 days; however, nexplanon is expired. Pt has occas pelvic pains, sharp and intermittent, usually 1-2 wks before she bleeds.   Sex activity: single partner, contraception - none. Nexplanon placed 10/2013 and expired. Would like OCPs. No hx of HTN, DVTs, migraines. Did them in past without side effects.  Last Pap: November 28, 2012  Results were: no abnormalities Hx of STDs: none. Wants STD testing.  There is no FH of breast cancer. There is no FH of ovarian cancer. The patient does not do self-breast exams.  Tobacco use: The patient denies current or previous tobacco use. Alcohol use: social drinker No drug use.  Exercise: not active  She does get adequate calcium and Vitamin D in her diet.   Past Medical History:  Diagnosis Date  . Obesity     Past Surgical History:  Procedure Laterality Date  . CHOLECYSTECTOMY  2015    Family History  Problem Relation Age of Onset  . Heart disease Father   . Hypertension Maternal Grandmother   . Hypertension Paternal Grandmother     Social History   Socioeconomic History  . Marital status: Single    Spouse name: Not on file  . Number of children: Not on file  . Years of education: Not on file  . Highest education level: Not on file  Occupational History  . Not on file  Social Needs  . Financial resource strain: Not on file  . Food insecurity:    Worry: Not on file    Inability: Not on file  . Transportation needs:    Medical: Not on file    Non-medical: Not on file  Tobacco Use  . Smoking status: Never Smoker  . Smokeless  tobacco: Never Used  Substance and Sexual Activity  . Alcohol use: Yes    Comment: occasionally  . Drug use: Never  . Sexual activity: Yes    Birth control/protection: Implant  Lifestyle  . Physical activity:    Days per week: Not on file    Minutes per session: Not on file  . Stress: Not on file  Relationships  . Social connections:    Talks on phone: Not on file    Gets together: Not on file    Attends religious service: Not on file    Active member of club or organization: Not on file    Attends meetings of clubs or organizations: Not on file    Relationship status: Not on file  . Intimate partner violence:    Fear of current or ex partner: Not on file    Emotionally abused: Not on file    Physically abused: Not on file    Forced sexual activity: Not on file  Other Topics Concern  . Not on file  Social History Narrative  . Not on file   No current outpatient medications on file prior to visit.   No current facility-administered medications on file prior to visit.       ROS:  Review of Systems  Constitutional: Negative for fatigue, fever  and unexpected weight change.  Respiratory: Negative for cough, shortness of breath and wheezing.   Cardiovascular: Negative for chest pain, palpitations and leg swelling.  Gastrointestinal: Negative for blood in stool, constipation, diarrhea, nausea and vomiting.  Endocrine: Negative for cold intolerance, heat intolerance and polyuria.  Genitourinary: Negative for dyspareunia, dysuria, flank pain, frequency, genital sores, hematuria, menstrual problem, pelvic pain, urgency, vaginal bleeding, vaginal discharge and vaginal pain.  Musculoskeletal: Negative for back pain, joint swelling and myalgias.  Skin: Negative for rash.  Neurological: Negative for dizziness, syncope, light-headedness, numbness and headaches.  Hematological: Negative for adenopathy.  Psychiatric/Behavioral: Negative for agitation, confusion, sleep disturbance and  suicidal ideas. The patient is not nervous/anxious.    BREAST: No symptoms   Objective: BP 122/62   Pulse 98   Ht 5\' 6"  (1.676 m)   Wt 242 lb (109.8 kg)   LMP 04/15/2018 (Approximate)   BMI 39.06 kg/m    Physical Exam Constitutional:      Appearance: She is well-developed.  Genitourinary:     Vulva, vagina, cervix, uterus, right adnexa and left adnexa normal.     No vulval lesion or tenderness noted.     No vaginal discharge, erythema or tenderness.     No cervical polyp.     Uterus is not enlarged or tender.     No right or left adnexal mass present.     Right adnexa not tender.     Left adnexa not tender.  Neck:     Musculoskeletal: Normal range of motion.     Thyroid: No thyromegaly.  Cardiovascular:     Rate and Rhythm: Normal rate and regular rhythm.     Heart sounds: Normal heart sounds. No murmur.  Pulmonary:     Effort: Pulmonary effort is normal.     Breath sounds: Normal breath sounds.  Chest:     Breasts:        Right: No mass, nipple discharge, skin change or tenderness.        Left: No mass, nipple discharge, skin change or tenderness.  Abdominal:     Palpations: Abdomen is soft.     Tenderness: There is no abdominal tenderness. There is no guarding.  Musculoskeletal: Normal range of motion.  Neurological:     Mental Status: She is alert and oriented to person, place, and time.     Cranial Nerves: No cranial nerve deficit.  Psychiatric:        Behavior: Behavior normal.  Vitals signs reviewed.    Nexplanon removal Procedure note - The Nexplanon was noted in the patient's arm and the end was identified. The skin was cleansed with a Betadine solution. A small injection of subcutaneous lidocaine with epinephrine was given over the end of the implant. An incision was made at the end of the implant. The rod was noted in the incision and grasped with a hemostat. It was noted to be intact.  Steri-Strip was placed approximating the incision. Hemostasis was  noted.   Assessment/Plan: Encounter for annual routine gynecological examination  Cervical cancer screening - Plan: Cytology - PAP, CANCELED: Cytology - PAP  Screening for STD (sexually transmitted disease) - Plan: HIV Antibody (routine testing w rflx), RPR, Hepatitis C antibody, Cytology - PAP  Encounter for initial prescription of contraceptive pills - OCP start tomorrow after neg first AM UPT. Condoms for 1 mo. Repeat UPT if no menses on first placebo wk. F/u prn. Rx lomedia - Plan: Norethindrone Acetate-Ethinyl Estrad-FE (MICROGESTIN 24 FE) 1-20 MG-MCG(24)  tablet  Nexplanon removal  Meds ordered this encounter  Medications  . Norethindrone Acetate-Ethinyl Estrad-FE (MICROGESTIN 24 FE) 1-20 MG-MCG(24) tablet    Sig: Take 1 tablet by mouth daily.    Dispense:  84 tablet    Refill:  3    Order Specific Question:   Supervising Provider    Answer:   Nadara MustardHARRIS, ROBERT P [[id number]]             GYN counsel family planning choices, adequate intake of calcium and vitamin D, diet and exercise   She was told to remove the dressing in 12-24 hours, to keep the incision area dry for 24 hours and to remove the Steristrip in 2-3  days.  Notify us if any signs of tenderness, redness, pain, or fevers develop.     F/U  Return in about 1 year (around 05/15/2019).  Oswin Johal B. Kassie Keng, PA-C 05/14/2018 3:17 PM

## 2018-05-15 LAB — RPR: RPR Ser Ql: NONREACTIVE

## 2018-05-15 LAB — HIV ANTIBODY (ROUTINE TESTING W REFLEX): HIV Screen 4th Generation wRfx: NONREACTIVE

## 2018-05-15 LAB — HEPATITIS C ANTIBODY: HEP C VIRUS AB: 0.4 {s_co_ratio} (ref 0.0–0.9)

## 2018-05-15 NOTE — Progress Notes (Signed)
Called and LVMTRC. 

## 2018-05-15 NOTE — Progress Notes (Signed)
Pls let pt know lab STD testing neg. Still waiting on pap. Thx

## 2018-05-15 NOTE — Progress Notes (Signed)
Called pt (cell), no answer, could not leave vm due to mailbox not set up.

## 2018-05-16 ENCOUNTER — Telehealth: Payer: Self-pay

## 2018-05-16 LAB — CYTOLOGY - PAP
CHLAMYDIA, DNA PROBE: NEGATIVE
Diagnosis: NEGATIVE
NEISSERIA GONORRHEA: NEGATIVE

## 2018-05-16 NOTE — Progress Notes (Signed)
Pt aware.

## 2018-05-16 NOTE — Telephone Encounter (Signed)
Pt calling to verify results.  [phone]  Adv Std tests and pap were all negative per ABC's note.

## 2018-05-16 NOTE — Progress Notes (Signed)
Pls let pt know pap/STD testing neg. Thx.

## 2018-09-11 ENCOUNTER — Ambulatory Visit (INDEPENDENT_AMBULATORY_CARE_PROVIDER_SITE_OTHER): Payer: Medicaid Other | Admitting: Obstetrics and Gynecology

## 2018-09-11 ENCOUNTER — Other Ambulatory Visit: Payer: Self-pay

## 2018-09-11 ENCOUNTER — Encounter: Payer: Self-pay | Admitting: Obstetrics and Gynecology

## 2018-09-11 VITALS — BP 116/68 | HR 70 | Ht 66.0 in | Wt 240.0 lb

## 2018-09-11 DIAGNOSIS — Z3009 Encounter for other general counseling and advice on contraception: Secondary | ICD-10-CM

## 2018-09-11 NOTE — Progress Notes (Signed)
Obstetrics & Gynecology Office Visit   Chief Complaint:  Chief Complaint  Patient presents with  . Consult    Tubal ligation     History of Present Illness: Patient is a 28 y.o. [id number] presenting for contraception consult.  She is currently on OCP (estrogen/progesterone) and desiring to start tubal ligation.  Prior to OCP was on nexplanon and switched to OCP.  States she has trouble remembering to take OCP consistently and menses have been slightly heavier and associated with dysmenorrhea.  She has a past medical history significant for no contraindication to estrogen.  She specifically denies a history of migraine with aura, chronic hypertension, history of DVT/PE and smoking.  Reported Patient's last menstrual period was 09/05/2018 (exact date)..    Review of Systems: Review of Systems  Constitutional: Negative for chills and fever.  HENT: Negative for congestion.   Respiratory: Negative for cough and shortness of breath.   Cardiovascular: Negative for chest pain and palpitations.  Gastrointestinal: Negative for abdominal pain, constipation, diarrhea, heartburn, nausea and vomiting.  Genitourinary: Negative for dysuria, frequency and urgency.  Skin: Negative for itching and rash.  Neurological: Negative for dizziness and headaches.  Endo/Heme/Allergies: Negative for polydipsia.  Psychiatric/Behavioral: Negative for depression.     Past Medical History:  Past Medical History:  Diagnosis Date  . Obesity     Past Surgical History:  Past Surgical History:  Procedure Laterality Date  . CHOLECYSTECTOMY  2015    Gynecologic History: Patient's last menstrual period was 09/05/2018 (exact date).  Obstetric History: G2P2003  Family History:  Family History  Problem Relation Age of Onset  . Heart disease Father   . Hypertension Maternal Grandmother   . Hypertension Paternal Grandmother     Social History:  Social History   Socioeconomic History  . Marital status:  Single    Spouse name: Not on file  . Number of children: Not on file  . Years of education: Not on file  . Highest education level: Not on file  Occupational History  . Not on file  Social Needs  . Financial resource strain: Not on file  . Food insecurity    Worry: Not on file    Inability: Not on file  . Transportation needs    Medical: Not on file    Non-medical: Not on file  Tobacco Use  . Smoking status: Never Smoker  . Smokeless tobacco: Never Used  Substance and Sexual Activity  . Alcohol use: Yes    Comment: occasionally  . Drug use: Never  . Sexual activity: Yes    Birth control/protection: Pill  Lifestyle  . Physical activity    Days per week: Not on file    Minutes per session: Not on file  . Stress: Not on file  Relationships  . Social Herbalist on phone: Not on file    Gets together: Not on file    Attends religious service: Not on file    Active member of club or organization: Not on file    Attends meetings of clubs or organizations: Not on file    Relationship status: Not on file  . Intimate partner violence    Fear of current or ex partner: Not on file    Emotionally abused: Not on file    Physically abused: Not on file    Forced sexual activity: Not on file  Other Topics Concern  . Not on file  Social History Narrative  . Not  on file    Allergies:  Allergies  Allergen Reactions  . Amoxicillin Hives  . Penicillins Hives    Medications: Prior to Admission medications   Medication Sig Start Date End Date Taking? Authorizing Provider  Norethindrone Acetate-Ethinyl Estrad-FE (MICROGESTIN 24 FE) 1-20 MG-MCG(24) tablet Take 1 tablet by mouth daily. 05/14/18  Yes Copland, Ilona SorrelAlicia B, PA-C    Physical Exam Vitals: Blood pressure 116/68, pulse 70, height 5\' 6"  (1.676 m), weight 240 lb (108.9 kg), last menstrual period 09/05/2018. Body mass index is 38.74 kg/m.  Patient's last menstrual period was 09/05/2018 (exact date).  General:  NAD, well nourished, appears stated age HEENT: normocephalic, anicteric Pulmonary: No increased work of breathing  Neurologic: Grossly intact Psychiatric: mood appropriate, affect full  Female chaperone present for pelvic  portions of the physical exam  Assessment: 28 y.o. [id number] tubal ligation consult  Plan: Problem List Items Addressed This Visit    None    Visit Diagnoses    General counseling and advice for contraceptive management    -  Primary     - given handout female female sterilization and IUD will discuss with husband.  I discussed one of the downsides of BTL is lack of cycle control.  Patient will often times require some sort of hormonal cycle control in setting of history of heavy menses going into a tubal ligation procedure.  She is also ware a tubal ligation is permanent and not reversible.  IUD we discussed as an alternative that has equal efficacy in regard to contraception but also good cycle control.    - to discuss contraceptive option with her husband, if she desires to proceed with tubal ligation she is aware it may be associated with some wait time given current OR capacity and back log secondary to COVID  - A total of 15 minutes were spent in face-to-face contact with the patient during this encounter with over half of that time devoted to counseling and coordination of care.   Vena AustriaAndreas Kerline Trahan, MD, Merlinda FrederickFACOG [facility], [facility] 09/11/2018, 12:32 PM

## 2018-11-01 ENCOUNTER — Telehealth: Payer: Self-pay

## 2018-11-01 NOTE — Telephone Encounter (Signed)
Pt is calling stating she has made her mind up and would like to have the BTL with AMS. She stated he told her to give him a call when she had made a decision. I have advised pt I would need to send this message to AMS and then nancy would be giving her a call here in a few days in regards to the surgery. Thank you

## 2018-11-14 ENCOUNTER — Telehealth: Payer: Self-pay | Admitting: Obstetrics and Gynecology

## 2018-11-14 NOTE — Telephone Encounter (Signed)
Contacted patient for Medicaid sterilization consent. Per patient, she will stop by the office tomorrow, and is aware to ask for Dr. Danielle Rankin nurse.

## 2018-11-14 NOTE — Telephone Encounter (Signed)
-----   Message from Malachy Mood, MD sent at 11/01/2018  5:10 PM EDT ----- Regarding: Surgery Surgery Date: 6 weeks  LOS: same day surgery  Surgery Booking Request Patient Full Name: Meagan Armstrong MRN: [id number]  DOB: September 04, 1990  Surgeon: Malachy Mood, MD  Requested Surgery Date and Time: 6 weeks Primary Diagnosis and Code: Surgical sterilization (Level 3) Secondary Diagnosis and Code:  Surgical Procedure: laparoscopic tubal ligation L&D Notification:N/A Admission Status: same day surgery Length of Surgery: 1hr Special Case Needs: none H&P: week of (date) Phone Interview or Office Pre-Admit: phone interview Interpreter: No Language: English Medical Clearance: No Special Scheduling Instructions: NEEDS TO SIGN 30 DAY MEDICAID PAPERS

## 2018-11-18 NOTE — Telephone Encounter (Signed)
Pt did not come by office before I left at 4:15

## 2018-12-12 NOTE — Telephone Encounter (Signed)
Lmtrc

## 2019-02-13 ENCOUNTER — Other Ambulatory Visit: Payer: Self-pay | Admitting: Otolaryngology

## 2019-02-13 DIAGNOSIS — R430 Anosmia: Secondary | ICD-10-CM

## 2019-03-03 ENCOUNTER — Other Ambulatory Visit: Payer: Self-pay

## 2019-03-03 ENCOUNTER — Ambulatory Visit
Admission: RE | Admit: 2019-03-03 | Discharge: 2019-03-03 | Disposition: A | Discharge status: Home / Self Care | Payer: Medicaid Other | Source: Ambulatory Visit | Attending: Otolaryngology | Admitting: Otolaryngology

## 2019-03-03 DIAGNOSIS — R430 Anosmia: Secondary | ICD-10-CM | POA: Insufficient documentation

## 2019-03-03 MED ORDER — GADOBUTROL 1 MMOL/ML IV SOLN
10.0000 mL | Freq: Once | INTRAVENOUS | Status: AC | PRN
  Administered 2019-03-03: 10 mL via INTRAVENOUS

## 2019-03-26 ENCOUNTER — Emergency Department: Payer: Medicaid Other

## 2019-03-26 ENCOUNTER — Encounter: Payer: Self-pay | Admitting: Emergency Medicine

## 2019-03-26 ENCOUNTER — Emergency Department
Admission: EM | Admit: 2019-03-26 | Discharge: 2019-03-26 | Disposition: A | Discharge status: Home / Self Care | Payer: Medicaid Other | Attending: Student in an Organized Health Care Education/Training Program | Admitting: Student in an Organized Health Care Education/Training Program

## 2019-03-26 ENCOUNTER — Other Ambulatory Visit: Payer: Self-pay

## 2019-03-26 DIAGNOSIS — Y939 Activity, unspecified: Secondary | ICD-10-CM | POA: Diagnosis not present

## 2019-03-26 DIAGNOSIS — S20219A Contusion of unspecified front wall of thorax, initial encounter: Secondary | ICD-10-CM | POA: Diagnosis not present

## 2019-03-26 DIAGNOSIS — Y999 Unspecified external cause status: Secondary | ICD-10-CM | POA: Insufficient documentation

## 2019-03-26 DIAGNOSIS — S39012A Strain of muscle, fascia and tendon of lower back, initial encounter: Secondary | ICD-10-CM | POA: Diagnosis not present

## 2019-03-26 DIAGNOSIS — Y929 Unspecified place or not applicable: Secondary | ICD-10-CM | POA: Insufficient documentation

## 2019-03-26 DIAGNOSIS — S161XXA Strain of muscle, fascia and tendon at neck level, initial encounter: Secondary | ICD-10-CM

## 2019-03-26 DIAGNOSIS — S199XXA Unspecified injury of neck, initial encounter: Secondary | ICD-10-CM | POA: Diagnosis present

## 2019-03-26 LAB — POCT PREGNANCY, URINE: Preg Test, Ur: NEGATIVE

## 2019-03-26 MED ORDER — METHOCARBAMOL 500 MG PO TABS: 500.0000 mg | ORAL_TABLET | Freq: Four times a day (QID) | ORAL | 0 refills | Status: DC

## 2019-03-26 MED ORDER — MELOXICAM 15 MG PO TABS: 15.0000 mg | ORAL_TABLET | Freq: Every day | ORAL | 0 refills | Status: DC
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
## 2019-04-19 ENCOUNTER — Other Ambulatory Visit: Payer: Self-pay | Admitting: Obstetrics and Gynecology

## 2019-04-19 DIAGNOSIS — Z30011 Encounter for initial prescription of contraceptive pills: Secondary | ICD-10-CM

## 2019-05-22 ENCOUNTER — Encounter: Payer: Self-pay | Admitting: Obstetrics and Gynecology

## 2019-05-22 ENCOUNTER — Other Ambulatory Visit: Payer: Self-pay

## 2019-05-22 ENCOUNTER — Ambulatory Visit (INDEPENDENT_AMBULATORY_CARE_PROVIDER_SITE_OTHER): Payer: Medicaid Other | Admitting: Obstetrics and Gynecology

## 2019-05-22 VITALS — BP 100/70 | Ht 66.0 in | Wt 252.0 lb

## 2019-05-22 DIAGNOSIS — Z0000 Encounter for general adult medical examination without abnormal findings: Secondary | ICD-10-CM

## 2019-05-22 DIAGNOSIS — Z3009 Encounter for other general counseling and advice on contraception: Secondary | ICD-10-CM | POA: Diagnosis not present

## 2019-05-22 DIAGNOSIS — Z01419 Encounter for gynecological examination (general) (routine) without abnormal findings: Secondary | ICD-10-CM

## 2019-05-22 DIAGNOSIS — Z3041 Encounter for surveillance of contraceptive pills: Secondary | ICD-10-CM

## 2019-05-22 MED ORDER — MICROGESTIN 24 FE 1-20 MG-MCG PO TABS: 1.0000 | ORAL_TABLET | Freq: Every day | ORAL | 0 refills | Status: DC

## 2019-05-22 NOTE — Patient Instructions (Signed)
I value your feedback and entrusting us with your care. If you get a [facility] patient survey, I would appreciate you taking the time to let us know about your experience today. Thank you!  As of March 06, 2019, your lab results will be released to your MyChart immediately, before I even have a chance to see them. Please give me time to review them and contact you if there are any abnormalities. Thank you for your patience.  

## 2019-05-22 NOTE — Progress Notes (Signed)
PCP:  Patient, No Pcp Per   Chief Complaint  Patient presents with  . Gynecologic Exam     HPI:      Ms. Meagan Armstrong is a 29 y.o. No obstetric history on file. who LMP was Patient's last menstrual period was 05/07/2019 (approximate)., presents today for her annual examination.  Her menses are monthly, last 4-5 days, mod flow, mod dysmen, takes tylenol with a little relief. Sx usually leg/joint pain and LBP.  Sex activity: single partner, contraception - OCPs. Stopped them last month when Rx ran out, using condoms now. Would like BTL again. Discussed with Dr. Georgianne Fick 6/20 but never sched due to covid. Would like this yr.   Last Pap: 05/14/18  Results were: no abnormalities  Hx of STDs: none.   There is no FH of breast cancer. There is no FH of ovarian cancer. The patient does not do self-breast exams.  Tobacco use: The patient denies current or previous tobacco use. Alcohol use: social drinker No drug use.  Exercise: min active  She does get adequate calcium but not Vitamin D in her diet.   Past Medical History:  Diagnosis Date  . Obesity     Past Surgical History:  Procedure Laterality Date  . CHOLECYSTECTOMY  2015    Family History  Problem Relation Age of Onset  . Heart disease Father   . Hypertension Maternal Grandmother   . Hypertension Paternal Grandmother     Social History   Socioeconomic History  . Marital status: Single    Spouse name: Not on file  . Number of children: Not on file  . Years of education: Not on file  . Highest education level: Not on file  Occupational History  . Not on file  Tobacco Use  . Smoking status: Never Smoker  . Smokeless tobacco: Never Used  Substance and Sexual Activity  . Alcohol use: Yes    Comment: occasionally  . Drug use: Never  . Sexual activity: Yes    Birth control/protection: Pill  Other Topics Concern  . Not on file  Social History Narrative  . Not on file   Social Determinants of Health    Financial Resource Strain:   . Difficulty of Paying Living Expenses: Not on file  Food Insecurity:   . Worried About Charity fundraiser in the Last Year: Not on file  . Ran Out of Food in the Last Year: Not on file  Transportation Needs:   . Lack of Transportation (Medical): Not on file  . Lack of Transportation (Non-Medical): Not on file  Physical Activity:   . Days of Exercise per Week: Not on file  . Minutes of Exercise per Session: Not on file  Stress:   . Feeling of Stress : Not on file  Social Connections:   . Frequency of Communication with Friends and Family: Not on file  . Frequency of Social Gatherings with Friends and Family: Not on file  . Attends Religious Services: Not on file  . Active Member of Clubs or Organizations: Not on file  . Attends Archivist Meetings: Not on file  . Marital Status: Not on file  Intimate Partner Violence:   . Fear of Current or Ex-Partner: Not on file  . Emotionally Abused: Not on file  . Physically Abused: Not on file  . Sexually Abused: Not on file   No current outpatient medications on file prior to visit.   No current facility-administered medications on file prior  to visit.      ROS:  Review of Systems  Constitutional: Negative for fatigue, fever and unexpected weight change.  Respiratory: Negative for cough, shortness of breath and wheezing.   Cardiovascular: Negative for chest pain, palpitations and leg swelling.  Gastrointestinal: Negative for blood in stool, constipation, diarrhea, nausea and vomiting.  Endocrine: Negative for cold intolerance, heat intolerance and polyuria.  Genitourinary: Negative for dyspareunia, dysuria, flank pain, frequency, genital sores, hematuria, menstrual problem, pelvic pain, urgency, vaginal bleeding, vaginal discharge and vaginal pain.  Musculoskeletal: Negative for back pain, joint swelling and myalgias.  Skin: Negative for rash.  Neurological: Negative for dizziness, syncope,  light-headedness, numbness and headaches.  Hematological: Negative for adenopathy.  Psychiatric/Behavioral: Negative for agitation, confusion, sleep disturbance and suicidal ideas. The patient is not nervous/anxious.    BREAST: No symptoms   Objective: BP 100/70   Ht 5\' 6"  (1.676 m)   Wt 252 lb (114.3 kg)   LMP 05/07/2019 (Approximate)   BMI 40.67 kg/m    Physical Exam Constitutional:      Appearance: She is well-developed.  Genitourinary:     Vulva, vagina, cervix, uterus, right adnexa and left adnexa normal.     No vulval lesion or tenderness noted.     No vaginal discharge, erythema or tenderness.     No cervical polyp.     Uterus is not enlarged or tender.     No right or left adnexal mass present.     Right adnexa not tender.     Left adnexa not tender.  Neck:     Thyroid: No thyromegaly.  Cardiovascular:     Rate and Rhythm: Normal rate and regular rhythm.     Heart sounds: Normal heart sounds. No murmur.  Pulmonary:     Effort: Pulmonary effort is normal.     Breath sounds: Normal breath sounds.  Chest:     Breasts:        Right: No mass, nipple discharge, skin change or tenderness.        Left: No mass, nipple discharge, skin change or tenderness.  Abdominal:     Palpations: Abdomen is soft.     Tenderness: There is no abdominal tenderness. There is no guarding.  Musculoskeletal:        General: Normal range of motion.     Cervical back: Normal range of motion.  Neurological:     General: No focal deficit present.     Mental Status: She is alert and oriented to person, place, and time.     Cranial Nerves: No cranial nerve deficit.  Skin:    General: Skin is warm and dry.  Psychiatric:        Mood and Affect: Mood normal.        Behavior: Behavior normal.        Thought Content: Thought content normal.        Judgment: Judgment normal.  Vitals reviewed.    Assessment/Plan: Encounter for annual routine gynecological examination  Encounter for  surveillance of contraceptive pills - Plan: Norethindrone Acetate-Ethinyl Estrad-FE (MICROGESTIN 24 FE) 1-20 MG-MCG(24) tablet; OCP RF. Restart with next menses if doesn't get BTL in time. Rx eRxd.   Encounter for other general counseling or advice on contraception--pt wants BTL. Will get sched, will need MCD waiver at pre-op. Sent to Nancy/Kellee for sched.   Meds ordered this encounter  Medications  . Norethindrone Acetate-Ethinyl Estrad-FE (MICROGESTIN 24 FE) 1-20 MG-MCG(24) tablet    Sig: Take 1 tablet  by mouth daily.    Dispense:  84 tablet    Refill:  0    Order Specific Question:   Supervising Provider    Answer:   Nadara Mustard [[id number]]             GYN counsel family planning choices, adequate intake of calcium and vitamin D, diet and exercise    F/U  Return in about 1 year (around 05/21/2020).  Iyahna Obriant B. Levon Boettcher, PA-C 05/22/2019 4:04 PM

## 2019-05-23 ENCOUNTER — Telehealth: Payer: Self-pay | Admitting: Obstetrics and Gynecology

## 2019-05-23 NOTE — Telephone Encounter (Signed)
-----   Message from Kimberly [place], [place] sent at 05/22/2019  4:36 PM EST ----- Regarding: FW: BTL surg appt Pt needs to be put on the nurse schedule and will need to sign BTL papers.  Cala Bradford  ----- Message ----- From: Lewis Moccasin Sent: 05/22/2019   4:24 PM EST To: Kimberly [place], CMA, April H Hawley Subject: FW: BTL surg appt                              Kellee,   Please contact the patient and let her know she needs to stop by the office and sign a sterilization consent w/ Dr. Ramiro Harvest nurse. She does not need an appointment. We will wait until we have the signed consent before scheduling. Thanks. ----- Message ----- From: Trenda Moots Sent: 05/22/2019   4:15 PM EST To: Lewis Moccasin, April H Hawley Subject: BTL surg appt                                  Pt would like BTL. Already discussed at appt with Dr. Bonney Aid 6/20. Never sched due to covid, wants now. Discussed with AS. Ok to sched surg, pt will need to RTO for pre-op with him and sign MCD waiver. Thx

## 2019-05-23 NOTE — Telephone Encounter (Signed)
Patient is schedule on Tuesday, 05/27/19 for nurse visit

## 2019-05-27 ENCOUNTER — Ambulatory Visit: Payer: Medicaid Other

## 2019-05-27 ENCOUNTER — Other Ambulatory Visit: Payer: Self-pay

## 2019-05-29 ENCOUNTER — Telehealth: Payer: Self-pay | Admitting: Obstetrics and Gynecology

## 2019-05-29 NOTE — Telephone Encounter (Addendum)
Called pt to schedule surgery with Dr Bonney Aid on Tuesday, 4/27.  H&P 4/12 @ 4:10pm  Covid testing Friday, 4/23, [address], drive up and wear a mask. Adv pt that she will need to quar after test until DOS.  Adv pt that she will also be scheduled for a peradmit phone call and the date/time will be available in her MyChart as well as it will be on the paper work she will receive at the H&P. Adv she may also receive calls from the pharmacy and pt center as well.  Pt conf Medicaid as current/only insurance. She signed consent form on 3/2.  Pt had questions that I said I would pass on to Dr. Bonney Aid.  PT QUESTIONS: Can she request a note for work to include the [employer] days (4/23 - DOS)?  With her surgery on Tuesday, 4/27, when will she be able to return to work?

## 2019-05-29 NOTE — Telephone Encounter (Signed)
-----   Message from Lewis Moccasin sent at 05/22/2019  4:24 PM EST ----- Regarding: FW: BTL surg appt Mickel Fuchs,   Please contact the patient and let her know she needs to stop by the office and sign a sterilization consent w/ Dr. Ramiro Harvest nurse. She does not need an appointment. We will wait until we have the signed consent before scheduling. Thanks. ----- Message ----- From: Trenda Moots Sent: 05/22/2019   4:15 PM EST To: Lewis Moccasin, April H Hawley Subject: BTL surg appt                                  Pt would like BTL. Already discussed at appt with Dr. Bonney Aid 6/20. Never sched due to covid, wants now. Discussed with AS. Ok to sched surg, pt will need to RTO for pre-op with him and sign MCD waiver. Thx

## 2019-07-08 NOTE — Telephone Encounter (Signed)
H&P 4/13 in [facility] @1 :30  H&P paperwork given to 

## 2019-07-09 ENCOUNTER — Encounter: Payer: Medicaid Other | Admitting: Obstetrics and Gynecology

## 2019-07-11 ENCOUNTER — Encounter
Admission: RE | Admit: 2019-07-11 | Discharge: 2019-07-11 | Disposition: A | Discharge status: Home / Self Care | Payer: Medicaid Other | Source: Ambulatory Visit | Attending: Obstetrics and Gynecology | Admitting: Obstetrics and Gynecology

## 2019-07-11 ENCOUNTER — Ambulatory Visit (INDEPENDENT_AMBULATORY_CARE_PROVIDER_SITE_OTHER): Payer: Medicaid Other | Admitting: Obstetrics and Gynecology

## 2019-07-11 ENCOUNTER — Other Ambulatory Visit: Payer: Self-pay

## 2019-07-11 ENCOUNTER — Encounter: Payer: Self-pay | Admitting: Obstetrics and Gynecology

## 2019-07-11 VITALS — BP 118/74 | HR 71 | Ht 66.0 in | Wt 251.0 lb

## 2019-07-11 DIAGNOSIS — Z308 Encounter for other contraceptive management: Secondary | ICD-10-CM

## 2019-07-11 DIAGNOSIS — Z01818 Encounter for other preprocedural examination: Secondary | ICD-10-CM

## 2019-07-11 HISTORY — DX: Gestational diabetes mellitus in pregnancy, unspecified control: O24.419

## 2019-07-11 NOTE — Progress Notes (Signed)
Obstetrics & Gynecology Surgery H&P    Chief Complaint: Scheduled Surgery   History of Present Illness: Patient is a 29 y.o. [id number] presenting for scheduled laparoscopic bilateral salpingectomy, for the treatment or further evaluation of undesired fertility.   Prior Treatments prior to proceeding with surgery include: discussed of alternative contraceptive measures, including LARCs and vasectomy  Preoperative Pap: 05/14/2018 Results: no abnormalities  Preoperative Endometrial biopsy: N/A Preoperative Ultrasound: N/A   Review of Systems:10 point review of systems  Past Medical History:  There are no problems to display for this patient.   Past Surgical History:  Past Surgical History:  Procedure Laterality Date  . CHOLECYSTECTOMY  2015    Family History:  Family History  Problem Relation Age of Onset  . Heart disease Father   . Hypertension Maternal Grandmother   . Hypertension Paternal Grandmother     Social History:  Social History   Socioeconomic History  . Marital status: Single    Spouse name: Not on file  . Number of children: Not on file  . Years of education: Not on file  . Highest education level: Not on file  Occupational History  . Not on file  Tobacco Use  . Smoking status: Never Smoker  . Smokeless tobacco: Never Used  Substance and Sexual Activity  . Alcohol use: Yes    Comment: occasionally  . Drug use: Never  . Sexual activity: Yes    Birth control/protection: None  Other Topics Concern  . Not on file  Social History Narrative  . Not on file   Social Determinants of Health   Financial Resource Strain:   . Difficulty of Paying Living Expenses:   Food Insecurity:   . Worried About Programme researcher, broadcasting/film/video in the Last Year:   . Barista in the Last Year:   Transportation Needs:   . Freight forwarder (Medical):   Marland Kitchen Lack of Transportation (Non-Medical):   Physical Activity:   . Days of Exercise per Week:   . Minutes of  Exercise per Session:   Stress:   . Feeling of Stress :   Social Connections:   . Frequency of Communication with Friends and Family:   . Frequency of Social Gatherings with Friends and Family:   . Attends Religious Services:   . Active Member of Clubs or Organizations:   . Attends Banker Meetings:   Marland Kitchen Marital Status:   Intimate Partner Violence:   . Fear of Current or Ex-Partner:   . Emotionally Abused:   Marland Kitchen Physically Abused:   . Sexually Abused:     Allergies:  Allergies  Allergen Reactions  . Amoxicillin Hives  . Penicillins Hives    Medications: Prior to Admission medications   Not on File    Physical Exam Vitals: Blood pressure 118/74, pulse 71, height 5\' 6"  (1.676 m), weight 251 lb (113.9 kg), last menstrual period 07/08/2019. General: NAD HEENT: normocephalic, anicteric Pulmonary: No increased work of breathing, CTAB Cardiovascular: RRR, distal pulses 2+ Abdomen: soft, non-tender Genitourinary: deferred Extremities: no edema, erythema, or tenderness Neurologic: Grossly intact Psychiatric: mood appropriate, affect full  Imaging No results found.  Assessment: 29 y.o. 36 presenting for scheduled preop laparoscopic bilateral slapingectomy  Plan: 1) 29 y.o. G2P2003  with undesired fertility, desires permanent sterilization.  Other reversible forms of contraception were discussed with patient; she declines all other modalities. Permanent nature of as well as associated risks of the procedure discussed with patient including but not limited  to: risk of regret, permanence of method, bleeding, infection, injury to surrounding organs and need for additional procedures.  Failure risk of 0.5-1% with increased risk of ectopic gestation if pregnancy occurs was also discussed with patient.     2) Routine postoperative instructions were reviewed with the patient and her family in detail today including the expected length of recovery and likely  postoperative course.  The patient concurred with the proposed plan, giving informed written consent for the surgery today.  Patient instructed on the importance of being NPO after midnight prior to her procedure.  If warranted preoperative prophylactic antibiotics and SCDs ordered on call to the OR to meet SCIP guidelines and adhere to recommendation laid forth in [facility] Number 104 May 2009  "Antibiotic Prophylaxis for Gynecologic Procedures".     Malachy Mood, MD, [facility], [facility] 07/11/2019, 9:02 AM

## 2019-07-11 NOTE — Patient Instructions (Signed)
Your procedure is scheduled on: 07/22/19 Report to [facility]. To find out your arrival time please call [phone] between 1PM - 3PM on 07/21/19.  Remember: Instructions that are not followed completely may result in serious medical risk, up to and including death, or upon the discretion of your surgeon and anesthesiologist your surgery may need to be rescheduled.     _X__ 1. Do not eat food after midnight the night before your procedure.                 No gum chewing or hard candies. You may drink clear liquids up to 2 hours                 before you are scheduled to arrive for your surgery- DO not drink clear                 liquids within 2 hours of the start of your surgery.                 Clear Liquids include:  water, apple juice without pulp, clear carbohydrate                 drink such as Clearfast or Gatorade, Black Coffee or Tea (Do not add                 anything to coffee or tea). Diabetics water only  __X__2.  On the morning of surgery brush your teeth with toothpaste and water, you                 may rinse your mouth with mouthwash if you wish.  Do not swallow any              toothpaste of mouthwash.     _X__ 3.  No Alcohol for 24 hours before or after surgery.   _X__ 4.  Do Not Smoke or use e-cigarettes For 24 Hours Prior to Your Surgery.                 Do not use any chewable tobacco products for at least 6 hours prior to                 surgery.  ____  5.  Bring all medications with you on the day of surgery if instructed.   __X__  6.  Notify your doctor if there is any change in your medical condition      (cold, fever, infections).     Do not wear jewelry, make-up, hairpins, clips or nail polish. Do not wear lotions, powders, or perfumes.  Do not shave 48 hours prior to surgery. Men may shave face and neck. Do not bring valuables to the hospital.    [facility] is not responsible for any belongings or  valuables.  Contacts, dentures/partials or body piercings may not be worn into surgery. Bring a case for your contacts, glasses or hearing aids, a denture cup will be supplied. Leave your suitcase in the car. After surgery it may be brought to your room. For patients admitted to the hospital, discharge time is determined by your treatment team.   Patients discharged the day of surgery will not be allowed to drive home.   Please read over the following fact sheets that you were given:   MRSA Information  __X__ Take these medicines the morning of surgery with A SIP OF WATER:  1.   2.   3.   4.  5.  6.  ____ Fleet Enema (as directed)   __X__ Use CHG Soap/SAGE wipes as directed  ____ Use inhalers on the day of surgery  ____ Stop metformin/Janumet/Farxiga 2 days prior to surgery    ____ Take 1/2 of usual insulin dose the night before surgery. No insulin the morning          of surgery.   ____ Stop Blood Thinners Coumadin/Plavix/Xarelto/Pleta/Pradaxa/Eliquis/Effient/Aspirin  on   Or contact your Surgeon, Cardiologist or Medical Doctor regarding  ability to stop your blood thinners  __X__ Stop Anti-inflammatories 7 days before surgery such as Advil, Ibuprofen, Motrin,  BC or Goodies Powder, Naprosyn, Naproxen, Aleve, Aspirin    __X__ Stop all herbal supplements, fish oil or vitamin E until after surgery.    ____ Bring C-Pap to the hospital.    THE ENSURE PRE SURGERY DRINK SHOULD BE CONSUMED 2 HOURS BEFORE YOUR ARRIVAL TO SURGERY.

## 2019-07-18 ENCOUNTER — Other Ambulatory Visit: Payer: Self-pay

## 2019-07-18 ENCOUNTER — Other Ambulatory Visit
Admission: RE | Admit: 2019-07-18 | Discharge: 2019-07-18 | Disposition: A | Discharge status: Home / Self Care | Payer: Medicaid Other | Source: Ambulatory Visit | Attending: Obstetrics and Gynecology | Admitting: Obstetrics and Gynecology

## 2019-07-18 DIAGNOSIS — Z01812 Encounter for preprocedural laboratory examination: Secondary | ICD-10-CM | POA: Insufficient documentation

## 2019-07-18 DIAGNOSIS — Z20822 Contact with and (suspected) exposure to covid-19: Secondary | ICD-10-CM | POA: Diagnosis not present

## 2019-07-18 LAB — HEMOGLOBIN AND HEMATOCRIT, BLOOD
HCT: 39.8 % (ref 36.0–46.0)
Hemoglobin: 13.9 g/dL (ref 12.0–15.0)

## 2019-07-18 LAB — TYPE AND SCREEN
ABO/RH(D): O POS
Antibody Screen: NEGATIVE

## 2019-07-18 LAB — SARS CORONAVIRUS 2 (TAT 6-24 HRS): SARS Coronavirus 2: NEGATIVE

## 2019-07-22 ENCOUNTER — Encounter: Payer: Self-pay | Admitting: Obstetrics and Gynecology

## 2019-07-22 ENCOUNTER — Other Ambulatory Visit: Payer: Self-pay

## 2019-07-22 ENCOUNTER — Ambulatory Visit: Payer: Medicaid Other | Admitting: Anesthesiology

## 2019-07-22 ENCOUNTER — Encounter
Admission: RE | Disposition: A | Discharge status: Home / Self Care | Payer: Self-pay | Source: Home / Self Care | Attending: Obstetrics and Gynecology

## 2019-07-22 ENCOUNTER — Ambulatory Visit
Admission: RE | Admit: 2019-07-22 | Discharge: 2019-07-22 | Disposition: A | Discharge status: Home / Self Care | Payer: Medicaid Other | Attending: Obstetrics and Gynecology | Admitting: Obstetrics and Gynecology

## 2019-07-22 DIAGNOSIS — Z6841 Body Mass Index (BMI) 40.0 and over, adult: Secondary | ICD-10-CM | POA: Insufficient documentation

## 2019-07-22 DIAGNOSIS — Z880 Allergy status to penicillin: Secondary | ICD-10-CM | POA: Insufficient documentation

## 2019-07-22 DIAGNOSIS — Z302 Encounter for sterilization: Secondary | ICD-10-CM | POA: Diagnosis present

## 2019-07-22 DIAGNOSIS — E669 Obesity, unspecified: Secondary | ICD-10-CM | POA: Insufficient documentation

## 2019-07-22 DIAGNOSIS — Z9889 Other specified postprocedural states: Secondary | ICD-10-CM

## 2019-07-22 DIAGNOSIS — Z3009 Encounter for other general counseling and advice on contraception: Secondary | ICD-10-CM

## 2019-07-22 HISTORY — PX: LAPAROSCOPIC TUBAL LIGATION: SHX1937

## 2019-07-22 LAB — POCT PREGNANCY, URINE: Preg Test, Ur: NEGATIVE

## 2019-07-22 SURGERY — LIGATION, FALLOPIAN TUBE, LAPAROSCOPIC
Anesthesia: General | Site: Abdomen | Laterality: Bilateral

## 2019-07-22 MED ORDER — PROMETHAZINE HCL 25 MG/ML IJ SOLN: 6.2500 mg | INTRAMUSCULAR | Status: DC | PRN

## 2019-07-22 MED ORDER — LIDOCAINE HCL (CARDIAC) PF 100 MG/5ML IV SOSY
PREFILLED_SYRINGE | INTRAVENOUS | Status: DC | PRN
  Administered 2019-07-22: 100 mg via INTRAVENOUS

## 2019-07-22 MED ORDER — FAMOTIDINE 20 MG PO TABS
20.0000 mg | ORAL_TABLET | Freq: Once | ORAL | Status: AC
  Administered 2019-07-22: 20 mg via ORAL

## 2019-07-22 MED ORDER — SCOPOLAMINE 1 MG/3DAYS TD PT72
MEDICATED_PATCH | TRANSDERMAL | Status: DC | PRN
  Administered 2019-07-22: 1 via TRANSDERMAL

## 2019-07-22 MED ORDER — DEXMEDETOMIDINE HCL IN NACL 200 MCG/50ML IV SOLN
INTRAVENOUS | Status: DC | PRN
  Administered 2019-07-22: 12 ug via INTRAVENOUS

## 2019-07-22 MED ORDER — DEXMEDETOMIDINE HCL IN NACL 80 MCG/20ML IV SOLN
INTRAVENOUS | Status: AC
  Filled 2019-07-22: qty 20

## 2019-07-22 MED ORDER — BUPIVACAINE HCL 0.5 % IJ SOLN
INTRAMUSCULAR | Status: DC | PRN
  Administered 2019-07-22: 10 mL

## 2019-07-22 MED ORDER — LIDOCAINE HCL (PF) 2 % IJ SOLN
INTRAMUSCULAR | Status: AC
  Filled 2019-07-22: qty 5

## 2019-07-22 MED ORDER — SUGAMMADEX SODIUM 200 MG/2ML IV SOLN
INTRAVENOUS | Status: DC | PRN
  Administered 2019-07-22: 200 mg via INTRAVENOUS
  Administered 2019-07-22: 100 mg via INTRAVENOUS

## 2019-07-22 MED ORDER — ONDANSETRON HCL 4 MG/2ML IJ SOLN
INTRAMUSCULAR | Status: DC | PRN
  Administered 2019-07-22: 4 mg via INTRAVENOUS

## 2019-07-22 MED ORDER — IBUPROFEN 600 MG PO TABS
600.0000 mg | ORAL_TABLET | Freq: Four times a day (QID) | ORAL | 3 refills | Status: DC | PRN
Start: 2019-07-22 — End: 2021-09-19

## 2019-07-22 MED ORDER — PROPOFOL 10 MG/ML IV BOLUS
INTRAVENOUS | Status: DC | PRN
  Administered 2019-07-22: 200 mg via INTRAVENOUS

## 2019-07-22 MED ORDER — MIDAZOLAM HCL 2 MG/2ML IJ SOLN
INTRAMUSCULAR | Status: DC | PRN
  Administered 2019-07-22: 2 mg via INTRAVENOUS

## 2019-07-22 MED ORDER — FENTANYL CITRATE (PF) 100 MCG/2ML IJ SOLN
25.0000 ug | INTRAMUSCULAR | Status: DC | PRN
  Administered 2019-07-22: 25 ug via INTRAVENOUS

## 2019-07-22 MED ORDER — FENTANYL CITRATE (PF) 100 MCG/2ML IJ SOLN
INTRAMUSCULAR | Status: AC
  Filled 2019-07-22: qty 2

## 2019-07-22 MED ORDER — OXYCODONE-ACETAMINOPHEN 5-325 MG PO TABS: 1.0000 | ORAL_TABLET | ORAL | 0 refills | Status: DC | PRN

## 2019-07-22 MED ORDER — DEXAMETHASONE SODIUM PHOSPHATE 10 MG/ML IJ SOLN
INTRAMUSCULAR | Status: AC
  Filled 2019-07-22: qty 1

## 2019-07-22 MED ORDER — FENTANYL CITRATE (PF) 100 MCG/2ML IJ SOLN
INTRAMUSCULAR | Status: DC | PRN
  Administered 2019-07-22: 50 ug via INTRAVENOUS

## 2019-07-22 MED ORDER — BUPIVACAINE HCL (PF) 0.5 % IJ SOLN
INTRAMUSCULAR | Status: AC
  Filled 2019-07-22: qty 30

## 2019-07-22 MED ORDER — FAMOTIDINE 20 MG PO TABS
ORAL_TABLET | ORAL | Status: AC
  Filled 2019-07-22: qty 1

## 2019-07-22 MED ORDER — MIDAZOLAM HCL 2 MG/2ML IJ SOLN
INTRAMUSCULAR | Status: AC
  Filled 2019-07-22: qty 2

## 2019-07-22 MED ORDER — PROPOFOL 10 MG/ML IV BOLUS
INTRAVENOUS | Status: AC
  Filled 2019-07-22: qty 20

## 2019-07-22 MED ORDER — ROCURONIUM BROMIDE 10 MG/ML (PF) SYRINGE
PREFILLED_SYRINGE | INTRAVENOUS | Status: AC
  Filled 2019-07-22: qty 10

## 2019-07-22 MED ORDER — FENTANYL CITRATE (PF) 100 MCG/2ML IJ SOLN
INTRAMUSCULAR | Status: AC
  Administered 2019-07-22: 25 ug via INTRAVENOUS
  Filled 2019-07-22: qty 2

## 2019-07-22 MED ORDER — DEXAMETHASONE SODIUM PHOSPHATE 10 MG/ML IJ SOLN
INTRAMUSCULAR | Status: DC | PRN
  Administered 2019-07-22: 10 mg via INTRAVENOUS

## 2019-07-22 MED ORDER — ROCURONIUM BROMIDE 100 MG/10ML IV SOLN
INTRAVENOUS | Status: DC | PRN
  Administered 2019-07-22: 60 mg via INTRAVENOUS

## 2019-07-22 MED ORDER — LACTATED RINGERS IV SOLN
INTRAVENOUS | Status: DC
  Administered 2019-07-22: 125 mL/h via INTRAVENOUS

## 2019-07-22 MED ORDER — OXYCODONE HCL 5 MG/5ML PO SOLN: 5.0000 mg | Freq: Once | ORAL | Status: AC | PRN

## 2019-07-22 MED ORDER — OXYCODONE HCL 5 MG PO TABS
5.0000 mg | ORAL_TABLET | Freq: Once | ORAL | Status: AC | PRN
  Administered 2019-07-22: 5 mg via ORAL

## 2019-07-22 MED ORDER — ONDANSETRON HCL 4 MG/2ML IJ SOLN
INTRAMUSCULAR | Status: AC
  Filled 2019-07-22: qty 2

## 2019-07-22 MED ORDER — SCOPOLAMINE 1 MG/3DAYS TD PT72
MEDICATED_PATCH | TRANSDERMAL | Status: AC
  Filled 2019-07-22: qty 1

## 2019-07-22 MED ORDER — OXYCODONE HCL 5 MG PO TABS
ORAL_TABLET | ORAL | Status: AC
  Filled 2019-07-22: qty 1

## 2019-07-22 SURGICAL SUPPLY — 25 items
BLADE SURG SZ11 CARB STEEL (BLADE) ×3 IMPLANT
CATH ROBINSON RED A/P 16FR (CATHETERS) ×3 IMPLANT
CHLORAPREP W/TINT 26 (MISCELLANEOUS) ×3 IMPLANT
COVER WAND RF STERILE (DRAPES) ×3 IMPLANT
DERMABOND ADVANCED (GAUZE/BANDAGES/DRESSINGS) ×2
DERMABOND ADVANCED .7 DNX12 (GAUZE/BANDAGES/DRESSINGS) ×1 IMPLANT
DRSG TEGADERM 2-3/8X2-3/4 SM (GAUZE/BANDAGES/DRESSINGS) ×3 IMPLANT
ELECT REM PT RETURN 9FT ADLT (ELECTROSURGICAL) ×3
ELECTRODE REM PT RTRN 9FT ADLT (ELECTROSURGICAL) ×1 IMPLANT
GLOVE BIO SURGEON STRL SZ7 (GLOVE) ×3 IMPLANT
GLOVE INDICATOR 7.5 STRL GRN (GLOVE) ×3 IMPLANT
GOWN STRL REUS W/ TWL LRG LVL3 (GOWN DISPOSABLE) ×2 IMPLANT
GOWN STRL REUS W/TWL LRG LVL3 (GOWN DISPOSABLE) ×6
KIT PINK PAD W/HEAD ARE REST (MISCELLANEOUS) ×3
KIT PINK PAD W/HEAD ARM REST (MISCELLANEOUS) ×1 IMPLANT
KIT TURNOVER CYSTO (KITS) ×3 IMPLANT
LABEL OR SOLS (LABEL) ×3 IMPLANT
NS IRRIG 500ML POUR BTL (IV SOLUTION) ×3 IMPLANT
PACK GYN LAPAROSCOPIC (MISCELLANEOUS) ×3 IMPLANT
PAD OB MATERNITY 4.3X12.25 (PERSONAL CARE ITEMS) ×3 IMPLANT
PAD PREP 24X41 OB/GYN DISP (PERSONAL CARE ITEMS) ×3 IMPLANT
SET TUBE SMOKE EVAC HIGH FLOW (TUBING) ×3 IMPLANT
SLEEVE ENDOPATH XCEL 5M (ENDOMECHANICALS) ×3 IMPLANT
SUT MNCRL AB 4-0 PS2 18 (SUTURE) ×3 IMPLANT
TROCAR XCEL NON-BLD 5MMX100MML (ENDOMECHANICALS) ×3 IMPLANT

## 2019-07-22 NOTE — OR Nursing (Signed)
Patient desires discharge pain a 2, tolerating po's, dc instructions given verbalized understanding.  Discharged vis wheelchair to home with mother.

## 2019-07-22 NOTE — H&P (Signed)
Date of Initial H&P: 07/11/2019  History reviewed, patient examined, no change in status, stable for surgery.

## 2019-07-22 NOTE — Progress Notes (Signed)
CH visited pt. while rounding in SDS; pt. says she is about to undergo surgery to 'have her tubes tied'; pt. somewhat reassured re: surgery by experience of having gallbladder surgery in the past.  Pt. appears to be coping effectively; no needs at this time.    07/22/19 0940  Clinical Encounter Type  Visited With Patient  Visit Type Initial;Pre-op  Referral From Other (Comment) (Routine Rounding)

## 2019-07-22 NOTE — Discharge Instructions (Signed)

## 2019-07-22 NOTE — Anesthesia Postprocedure Evaluation (Signed)
Anesthesia Post Note  Patient: Meagan Armstrong  Procedure(s) Performed: LAPAROSCOPIC bilateral salpingectomy (Bilateral Abdomen)  Patient location during evaluation: PACU Anesthesia Type: General Level of consciousness: awake and alert and oriented Pain management: pain level controlled Vital Signs Assessment: post-procedure vital signs reviewed and stable Respiratory status: spontaneous breathing, nonlabored ventilation and respiratory function stable Cardiovascular status: blood pressure returned to baseline and stable Postop Assessment: no signs of nausea or vomiting Anesthetic complications: no     Last Vitals:  Vitals:   07/22/19 1158 07/22/19 1208  BP: 99/64 104/66  Pulse: 82 64  Resp: 16 17  Temp: (!) 36.3 C   SpO2: 98% 95%    Last Pain:  Vitals:   07/22/19 1158  TempSrc:   PainSc: 3                  Ivan Maskell

## 2019-07-22 NOTE — Anesthesia Preprocedure Evaluation (Signed)
Anesthesia Evaluation  Patient identified by MRN, date of birth, ID band Patient awake    Reviewed: Allergy & Precautions, H&P , NPO status , Patient's Chart, lab work & pertinent test results  History of Anesthesia Complications (+) Emergence Delirium and history of anesthetic complications  Airway Mallampati: III  TM Distance: >3 FB Neck ROM: full    Dental  (+) Teeth Intact   Pulmonary neg pulmonary ROS,    breath sounds clear to auscultation       Cardiovascular negative cardio ROS   Rhythm:regular Rate:Normal     Neuro/Psych negative neurological ROS  negative psych ROS   GI/Hepatic negative GI ROS, Neg liver ROS,   Endo/Other  diabetes  Renal/GU      Musculoskeletal   Abdominal   Peds  Hematology negative hematology ROS (+)   Anesthesia Other Findings Past Medical History: No date: Gestational diabetes No date: Obesity  Past Surgical History: 2015: CHOLECYSTECTOMY  BMI    Body Mass Index: 40.35 kg/m      Reproductive/Obstetrics negative OB ROS                             Anesthesia Physical Anesthesia Plan  ASA: II  Anesthesia Plan: General ETT   Post-op Pain Management:    Induction:   PONV Risk Score and Plan: Ondansetron, Dexamethasone, Midazolam and Treatment may vary due to age or medical condition  Airway Management Planned:   Additional Equipment:   Intra-op Plan:   Post-operative Plan:   Informed Consent: I have reviewed the patients History and Physical, chart, labs and discussed the procedure including the risks, benefits and alternatives for the proposed anesthesia with the patient or authorized representative who has indicated his/her understanding and acceptance.     Dental Advisory Given  Plan Discussed with: Anesthesiologist  Anesthesia Plan Comments:         Anesthesia Quick Evaluation

## 2019-07-22 NOTE — Op Note (Signed)
Preoperative Diagnosis: 1) 29 y.o. with undesired fertility  Postoperative Diagnosis: 1) 29 y.o. with undesired fertility   Operation Performed: Laparoscopic bilateral salpingectomy  Indication: 29 y.o. [id number]  with undesired fertility, desires permanent sterilization.  Other reversible forms of contraception were discussed with patient; she declines all other modalities. Permanent nature of as well as associated risks of the procedure discussed with patient including but not limited to: risk of regret, permanence of method, bleeding, infection, injury to surrounding organs and need for additional procedures.   Surgeon: Vena Austria, MD  Anesthesia: General  Preoperative Antibiotics: none  Estimated Blood Loss: 5 mL  IV Fluids:  Urine Output::  Drains or Tubes: none  Implants: none  Specimens Removed: bilateral fallopian tubes  Complications: none  Intraoperative Findings: Normal tubes, ovaries, and uterus.   Patient Condition: stable  Procedure in Detail:  Patient was taken to the operating room where she was administered general anesthesia.  She was positioned in the dorsal lithotomy position utilizing Allen stirups, prepped and draped in the usual sterile fashion.  Prior to proceeding with procedure a time out was performed.  Attention was turned to the patient's pelvis.  A red rubber catheter was used to empty the patient's bladder.  An operative speculum was placed to allow visualization of the cervix.  The anterior lip of the cervix was grasped with a single tooth tenaculum, and a Hulka tenaculum was placed to allow manipulation of the uterus.  The operative speculum and single tooth tenaculum were then removed.  Attention was turned to the patient's abdomen.  The umbilicus was infiltrated with 1% Sensorcaine, before making a stab incision using an 11 blade scalpel.  A 79mm Excel trocar was then used to gain direct entry into the peritoneal cavity utilizing  the camera to visualize progress of the trocar during placement.  Once peritoneal entry had been achieved, insufflation was started and pneumoperitoneum established at a pressure of .   General inspection of the abdomen revealed the above noted findings.  A spot 2cm midline above the pubic symphysis and left lower quadrant were injected with 1% Sensorcaine and stab incisions were made using an 11 blade scalpel.  Two additional 75mm excel trocars wre placed through these incisions.  The right tube was identified and grasped in the fimbriated portion, transected from it attachments to the ovary, mesosalpinx, and uterus using a 52mm Harmonic scalpel.  The same procedure was then repeated for the left tube.  The tubes were removed through the left lower quadrant 3mm trocar site. All pedicles were re-inspected an noted to be hemostatic.  Pneumoperitoneum was evacuated.  The trocars were removed.   All trocar sites were then dressed with surgical skin glue.  The Hulka tenaculum was removed.  Sponge needle and instrument counts were correct time two.  The patient tolerated the procedure well and was taken to the recovery room in stable condition.

## 2019-07-22 NOTE — Anesthesia Procedure Notes (Signed)
Procedure Name: Intubation Date/Time: 07/22/2019 10:31 AM Performed by: Rona Ravens, CRNA Pre-anesthesia Checklist: Patient identified, Emergency Drugs available, Suction available, Patient being monitored and Timeout performed Patient Re-evaluated:Patient Re-evaluated prior to induction Oxygen Delivery Method: Circle system utilized Preoxygenation: Pre-oxygenation with 100% oxygen Induction Type: IV induction Ventilation: Mask ventilation without difficulty Laryngoscope Size: Mac and 4 Grade View: Grade II Tube type: Oral Tube size: 7.0 mm Number of attempts: 1 Airway Equipment and Method: Stylet Placement Confirmation: ETT inserted through vocal cords under direct vision,  positive ETCO2,  CO2 detector and breath sounds checked- equal and bilateral Secured at: 21 cm Tube secured with: Tape Dental Injury: Teeth and Oropharynx as per pre-operative assessment

## 2019-07-22 NOTE — Transfer of Care (Signed)
Immediate Anesthesia Transfer of Care Note  Patient: Meagan Armstrong  Procedure(s) Performed: LAPAROSCOPIC bilateral salpingectomy (Bilateral Abdomen)  Patient Location: PACU  Anesthesia Type:General  Level of Consciousness: awake and alert   Airway & Oxygen Therapy: Patient Spontanous Breathing and Patient connected to face mask oxygen  Post-op Assessment: Report given to RN and Post -op Vital signs reviewed and stable  Post vital signs: Reviewed and stable  Last Vitals:  Vitals Value Taken Time  BP    Temp    Pulse 85 07/22/19 1129  Resp 19 07/22/19 1129  SpO2 100 % 07/22/19 1129  Vitals shown include unvalidated device data.  Last Pain:  Vitals:   07/22/19 0921  TempSrc: Tympanic  PainSc: 0-No pain         Complications: No apparent anesthesia complications

## 2019-07-23 ENCOUNTER — Telehealth: Payer: Self-pay

## 2019-07-23 ENCOUNTER — Other Ambulatory Visit: Payer: Self-pay | Admitting: Obstetrics and Gynecology

## 2019-07-23 LAB — SURGICAL PATHOLOGY

## 2019-07-23 MED ORDER — HYDROCODONE-ACETAMINOPHEN 5-325 MG PO TABS: 1.0000 | ORAL_TABLET | Freq: Four times a day (QID) | ORAL | 0 refills | Status: DC | PRN

## 2019-07-23 NOTE — Telephone Encounter (Signed)
Dr. Bonney Aid texted me back stating he sent in Norco for pt on the stipulation she return the Percocet to pharmacy for proper disposal.   I spoke to the pt and she stated she is feeling a bit better but she has been resting. She stated an understanding and will take the Percocet to the pharmacy for disposal.  I advised her to keep her follow up appointment.

## 2019-07-23 NOTE — Telephone Encounter (Signed)
Patient reports she had tubal ligation yesterday with [facility]. She called after hours nurse yesterday to notify that she had redness of her throat and blitheness on her face. They advised her she was fine. She was fine when she woke up. She took pain medication and after 30 minutes her eyes were blotchy again. She took the oxycodone/acetamenofen. It turned her face really hot. She is inquiring if anything else can be prescribed and if she should take Benadryl. Cb#[phone]

## 2019-07-23 NOTE — Telephone Encounter (Signed)
Spoke w/patient. She did not take benadryl, she is ok now that the meds are wearing off. She still has dry, red throat from irritation of being intubated from surgery. Pain wise, she is doing ok, just sore on the left side mostly where incision is. Would prefer not to take Percocet again d/t the facial flushing/feeling she experienced.

## 2019-07-23 NOTE — Progress Notes (Signed)
Reports allergic reaction to percocet flushing eyes and blotchy skin.  I contacted pharmacy.  I will rx Vicodin with the stipulation that the patient return any unused percocet for proper disposal by the pharmacy.

## 2019-07-23 NOTE — Telephone Encounter (Signed)
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 But sending to [facility]

## 2019-07-28 ENCOUNTER — Ambulatory Visit (INDEPENDENT_AMBULATORY_CARE_PROVIDER_SITE_OTHER): Payer: Medicaid Other | Admitting: Obstetrics and Gynecology

## 2019-07-28 ENCOUNTER — Other Ambulatory Visit: Payer: Self-pay

## 2019-07-28 ENCOUNTER — Encounter: Payer: Self-pay | Admitting: Obstetrics and Gynecology

## 2019-07-28 VITALS — BP 116/60 | Wt 251.0 lb

## 2019-07-28 DIAGNOSIS — Z4889 Encounter for other specified surgical aftercare: Secondary | ICD-10-CM

## 2019-07-28 NOTE — Progress Notes (Signed)
Postoperative Follow-up Patient presents post op from  laparoscopic bilateral salpingectomy 1weeks ago for undesired fertility.  Subjective: Patient reports marked improvement in her preop symptoms. Eating a regular diet without difficulty. Pain is controlled without any medications.  Activity: normal activities of daily living.  Objective: Blood pressure 116/60, weight 251 lb (113.9 kg), last menstrual period 07/08/2019.  General: NAD Pulmonary: no increased work of breathing Abdomen: soft, non-tender, non-distended, incision(s) D/C/I Extremities: no edema Neurologic: normal gait  Admission on 07/22/2019, Discharged on 07/22/2019  Component Date Value Ref Range Status  . Preg Test, Ur 07/22/2019 NEGATIVE  NEGATIVE Final   Comment:        THE SENSITIVITY OF THIS METHODOLOGY IS >24 mIU/mL   . SURGICAL PATHOLOGY 07/22/2019    Final-Edited                   Value:SURGICAL PATHOLOGY CASE: [id number] PATIENT: Meagan Armstrong Surgical Pathology Report     Specimen Submitted: A. Fallopian tubes, bilateral  Clinical History: Surgical sterilization    DIAGNOSIS: A. FALLOPIAN TUBES, BILATERAL; STERILIZATION: - BILATERAL FALLOPIAN TUBES WITH NO SIGNIFICANT HISTOPATHOLOGIC CHANGE, SEEN ON FULL CROSS SECTION X6 EACH.  GROSS DESCRIPTION: A. Labeled: Bilateral fallopian tubes Received: Formalin Tissue fragment(s): 4; designated as fallopian tube #1 and #2 as well as additional freely floating fragments of tan-pink soft tissue. Size: Fallopian tube #1 - 6.5 cm in length by 0.5 cm in diameter. Fallopian tube #2 - 5.5 cm in length by 0.5 cm in diameter.  Additional freely floating fragments of tan-pink soft tissue - aggregate, 1.5 x 0.6 x 0.3 cm. Description: Received are unoriented, fimbriated portions of fallopian tube.  The fimbria associated with fallopian tube #2 are slightly disrupted and otherwise grossly unremarkable                         .  The external  surface of both fallopian tubes is tan-pink and smooth.  Sectioning of both fallopian tubes displays a pinpoint, grossly unremarkable lumen. Additionally received freely floating in the specimen container are 2 irregular fragments of tan-pink, possible fallopian tube parenchyma.  No abnormalities are grossly identified.  Representative sections are submitted as follows: 1 - fallopian tube #1 with longitudinally sectioned fimbria and cross-sections 2 - fallopian tube #2 with longitudinally sectioned, slightly disrupted fimbria and cross-sections 3 - entire, intact freely floating irregular fragments of tan-pink, possible fallopian tube parenchyma   Final Diagnosis performed by Katherine Mantle, MD.   Electronically signed 07/23/2019 8:36:51AM The electronic signature indicates that the named Attending Pathologist has evaluated the specimen Technical component performed at [facility], [address], [place], [place] [address] Lab: [phone] Dir: Jolene Schimke                         , MD, MMM  Professional component performed at [facility], [facility], [address] [place], [place], [place] [address] Lab: [phone] Dir: Georgiann Cocker. Oneita Kras, MD     Assessment: 29 y.o. s/p laparoscopic bilateral salpingectomy stable  Plan: Patient has done well after surgery with no apparent complications.  I have discussed the post-operative course to date, and the expected progress moving forward.  The patient understands what complications to be concerned about.  I will see the patient in routine follow up, or sooner if needed.    Activity plan: No restriction.   Vena Austria, MD, [facility], [facility] 07/28/2019,  4:20 PM

## 2021-07-20 ENCOUNTER — Other Ambulatory Visit: Payer: Self-pay

## 2021-07-20 ENCOUNTER — Emergency Department: Payer: Medicaid Other

## 2021-07-20 ENCOUNTER — Emergency Department
Admission: EM | Admit: 2021-07-20 | Discharge: 2021-07-20 | Disposition: A | Discharge status: Home / Self Care | Payer: Medicaid Other | Attending: Emergency Medicine | Admitting: Emergency Medicine

## 2021-07-20 DIAGNOSIS — M25562 Pain in left knee: Secondary | ICD-10-CM | POA: Diagnosis present

## 2021-07-20 DIAGNOSIS — M25462 Effusion, left knee: Secondary | ICD-10-CM

## 2021-07-20 MED ORDER — MELOXICAM 15 MG PO TABS: 15.0000 mg | ORAL_TABLET | Freq: Every day | ORAL | 0 refills | Status: AC

## 2021-07-20 NOTE — ED Triage Notes (Signed)
Pt comes with c/o left knee pain for few days. Pt states she injured it years ago. Pt states she feels like her knee buckled and wont go back the patient states pain when walking. ?

## 2021-07-20 NOTE — ED Provider Notes (Signed)
? ?  [facility] ?Provider Note ? ? ? Event Date/Time  ? First MD Initiated Contact with Patient 07/20/21 1857   ?  (approximate) ? ?History  ? ?Chief Complaint: Knee Pain ? ?HPI ? ?Meagan Armstrong is a 31 y.o. female with a past medical history of obesity presents emergency department for left knee pain.  According to the patient approximately 1 year ago while doing jumping jacks she felt significant pain in the left knee and states it buckled and gave out on her.  She states since that time every few months she will have her knee buckle, this month that is now happened 2-3 times in this past time which occurred today has been more significant with increased pain.  Patient denies any trauma or falls.  Has been able to walk but states pain with ambulation. ? ?Physical Exam  ? ?Triage Vital Signs: ?ED Triage Vitals  ?Enc Vitals Group  ?   BP 07/20/21 1755 120/80  ?   Pulse Rate 07/20/21 1755 94  ?   Resp 07/20/21 1755 18  ?   Temp 07/20/21 1755 98.2 ?F (36.8 ?C)  ?   Temp src --   ?   SpO2 07/20/21 1755 99 %  ?   Weight --   ?   Height --   ?   Head Circumference --   ?   Peak Flow --   ?   Pain Score 07/20/21 1754 10  ?   Pain Loc --   ?   Pain Edu? --   ?   Excl. in GC? --   ? ? ?Most recent vital signs: ?Vitals:  ? 07/20/21 1755  ?BP: 120/80  ?Pulse: 94  ?Resp: 18  ?Temp: 98.2 ?F (36.8 ?C)  ?SpO2: 99%  ? ? ?General: Awake, no distress.  ?CV:  Good peripheral perfusion.  Regular rate and rhythm  ?Resp:  Normal effort.  Equal breath sounds bilaterally.  ?Abd:  No distention.  Soft, nontender.  No rebound or guarding. ?Other:  Mild tenderness palpation of the left knee small joint effusion palpated.  Neurovascular intact distally.  Good range of motion in the knee largely pain-free with passive range of motion. ? ? ?ED Results / Procedures / Treatments  ? ?RADIOLOGY ? ?I personally reviewed the knee x-ray images no acute finding on my evaluation.  X-rays negative for acute  fracture. ? ? ?MEDICATIONS ORDERED IN ED: ?Medications - No data to display ? ? ?IMPRESSION / MDM / ASSESSMENT AND PLAN / ED COURSE  ?I reviewed the triage vital signs and the nursing notes. ? ?Patient presents the emergency department for left knee pain.  Patient states frequent knee buckling/giving out that is becoming more frequent.  Patient's symptoms are very suggestive of likely ligamentous injury in the knee.  We will obtain x-rays as a precaution.  If the x-rays are normal we will refer to orthopedics for further evaluation and likely MRI to discuss further management.  Patient agreeable to plan of care. ? ?X-ray negative we will refer to orthopedics.  Patient agreeable to plan of care. ? ?FINAL CLINICAL IMPRESSION(S) / ED DIAGNOSES  ? ?Left knee pain ? ? ? ?Note:  This document was prepared using Dragon voice recognition software and may include unintentional dictation errors. ?  ?Minna Antis, MD ?07/20/21 2003 ? ?

## 2021-07-20 NOTE — Discharge Instructions (Addendum)
Please call the number provided for orthopedics to arrange a follow-up appointment. ?

## 2021-07-22 ENCOUNTER — Other Ambulatory Visit: Payer: Self-pay | Admitting: Orthopedic Surgery

## 2021-07-22 DIAGNOSIS — M2392 Unspecified internal derangement of left knee: Secondary | ICD-10-CM

## 2021-07-22 DIAGNOSIS — S8392XA Sprain of unspecified site of left knee, initial encounter: Secondary | ICD-10-CM

## 2021-07-22 DIAGNOSIS — M1730 Unilateral post-traumatic osteoarthritis, unspecified knee: Secondary | ICD-10-CM

## 2021-07-22 DIAGNOSIS — M2352 Chronic instability of knee, left knee: Secondary | ICD-10-CM

## 2021-07-29 ENCOUNTER — Ambulatory Visit
Admission: RE | Admit: 2021-07-29 | Discharge: 2021-07-29 | Disposition: A | Discharge status: Home / Self Care | Payer: Medicaid Other | Source: Ambulatory Visit | Attending: Orthopedic Surgery | Admitting: Orthopedic Surgery

## 2021-07-29 DIAGNOSIS — S8392XA Sprain of unspecified site of left knee, initial encounter: Secondary | ICD-10-CM

## 2021-07-29 DIAGNOSIS — M2352 Chronic instability of knee, left knee: Secondary | ICD-10-CM

## 2021-07-29 DIAGNOSIS — M2392 Unspecified internal derangement of left knee: Secondary | ICD-10-CM

## 2021-09-08 ENCOUNTER — Other Ambulatory Visit: Payer: Self-pay | Admitting: Orthopedic Surgery

## 2021-09-19 ENCOUNTER — Encounter
Admission: RE | Admit: 2021-09-19 | Discharge: 2021-09-19 | Disposition: A | Discharge status: Home / Self Care | Payer: Medicaid Other | Source: Ambulatory Visit | Attending: Orthopedic Surgery | Admitting: Orthopedic Surgery

## 2021-09-19 VITALS — Ht 66.0 in | Wt 248.0 lb

## 2021-09-19 DIAGNOSIS — Z01812 Encounter for preprocedural laboratory examination: Secondary | ICD-10-CM

## 2021-09-26 ENCOUNTER — Encounter
Admission: RE | Disposition: A | Discharge status: Home / Self Care | Payer: Self-pay | Source: Ambulatory Visit | Attending: Orthopedic Surgery

## 2021-09-26 ENCOUNTER — Ambulatory Visit: Payer: Medicaid Other | Admitting: Anesthesiology

## 2021-09-26 ENCOUNTER — Other Ambulatory Visit: Payer: Self-pay

## 2021-09-26 ENCOUNTER — Ambulatory Visit: Payer: Medicaid Other

## 2021-09-26 ENCOUNTER — Ambulatory Visit
Admission: RE | Admit: 2021-09-26 | Discharge: 2021-09-26 | Disposition: A | Discharge status: Home / Self Care | Payer: Medicaid Other | Source: Ambulatory Visit | Attending: Orthopedic Surgery | Admitting: Orthopedic Surgery

## 2021-09-26 ENCOUNTER — Encounter: Payer: Self-pay | Admitting: Orthopedic Surgery

## 2021-09-26 DIAGNOSIS — S83512A Sprain of anterior cruciate ligament of left knee, initial encounter: Secondary | ICD-10-CM | POA: Insufficient documentation

## 2021-09-26 DIAGNOSIS — S83242A Other tear of medial meniscus, current injury, left knee, initial encounter: Secondary | ICD-10-CM | POA: Diagnosis not present

## 2021-09-26 DIAGNOSIS — Z01812 Encounter for preprocedural laboratory examination: Secondary | ICD-10-CM

## 2021-09-26 HISTORY — PX: KNEE ARTHROSCOPY WITH ANTERIOR CRUCIATE LIGAMENT (ACL) REPAIR WITH HAMSTRING GRAFT: SHX5645

## 2021-09-26 LAB — POCT PREGNANCY, URINE: Preg Test, Ur: NEGATIVE

## 2021-09-26 SURGERY — KNEE ARTHROSCOPY WITH ANTERIOR CRUCIATE LIGAMENT (ACL) REPAIR WITH HAMSTRING GRAFT
Anesthesia: Regional | Site: Knee | Laterality: Left

## 2021-09-26 MED ORDER — FENTANYL CITRATE (PF) 100 MCG/2ML IJ SOLN
INTRAMUSCULAR | Status: AC
  Administered 2021-09-26: 50 ug via INTRAVENOUS
  Filled 2021-09-26: qty 2

## 2021-09-26 MED ORDER — CEFAZOLIN SODIUM-DEXTROSE 2-4 GM/100ML-% IV SOLN
2.0000 g | INTRAVENOUS | Status: AC
  Administered 2021-09-26: 2 g via INTRAVENOUS

## 2021-09-26 MED ORDER — FENTANYL CITRATE (PF) 100 MCG/2ML IJ SOLN
25.0000 ug | INTRAMUSCULAR | Status: DC | PRN
  Administered 2021-09-26: 50 ug via INTRAVENOUS

## 2021-09-26 MED ORDER — VANCOMYCIN HCL 1000 MG IV SOLR
INTRAVENOUS | Status: AC
  Filled 2021-09-26: qty 20

## 2021-09-26 MED ORDER — ACETAMINOPHEN 10 MG/ML IV SOLN: 1000.0000 mg | Freq: Once | INTRAVENOUS | Status: DC | PRN

## 2021-09-26 MED ORDER — SEVOFLURANE IN SOLN
RESPIRATORY_TRACT | Status: AC
  Filled 2021-09-26: qty 250

## 2021-09-26 MED ORDER — FENTANYL CITRATE PF 50 MCG/ML IJ SOSY
PREFILLED_SYRINGE | INTRAMUSCULAR | Status: AC
  Administered 2021-09-26: 50 ug via INTRAVENOUS
  Filled 2021-09-26: qty 1

## 2021-09-26 MED ORDER — LACTATED RINGERS IV SOLN: INTRAVENOUS | Status: DC

## 2021-09-26 MED ORDER — FENTANYL CITRATE PF 50 MCG/ML IJ SOSY: 50.0000 ug | PREFILLED_SYRINGE | Freq: Once | INTRAMUSCULAR | Status: AC

## 2021-09-26 MED ORDER — FENTANYL CITRATE PF 50 MCG/ML IJ SOSY: 50.0000 ug | PREFILLED_SYRINGE | Freq: Once | INTRAMUSCULAR | Status: DC

## 2021-09-26 MED ORDER — MIDAZOLAM HCL 2 MG/2ML IJ SOLN
1.0000 mg | INTRAMUSCULAR | Status: AC | PRN
  Administered 2021-09-26: 1 mg via INTRAVENOUS

## 2021-09-26 MED ORDER — LIDOCAINE-EPINEPHRINE 1 %-1:100000 IJ SOLN
INTRAMUSCULAR | Status: AC
  Filled 2021-09-26: qty 1

## 2021-09-26 MED ORDER — RINGERS IRRIGATION IR SOLN
Status: DC | PRN
  Administered 2021-09-26: 12000 mL

## 2021-09-26 MED ORDER — CEFAZOLIN SODIUM-DEXTROSE 2-4 GM/100ML-% IV SOLN
INTRAVENOUS | Status: AC
  Filled 2021-09-26: qty 100

## 2021-09-26 MED ORDER — ACETAMINOPHEN 10 MG/ML IV SOLN
INTRAVENOUS | Status: DC | PRN
  Administered 2021-09-26: 1000 mg via INTRAVENOUS

## 2021-09-26 MED ORDER — PROPOFOL 10 MG/ML IV BOLUS
INTRAVENOUS | Status: DC | PRN
  Administered 2021-09-26: 200 mg via INTRAVENOUS

## 2021-09-26 MED ORDER — ONDANSETRON 4 MG PO TBDP: 4.0000 mg | ORAL_TABLET | Freq: Three times a day (TID) | ORAL | 0 refills | Status: AC | PRN

## 2021-09-26 MED ORDER — OXYCODONE HCL 5 MG/5ML PO SOLN: 5.0000 mg | Freq: Once | ORAL | Status: AC | PRN

## 2021-09-26 MED ORDER — KETOROLAC TROMETHAMINE 30 MG/ML IJ SOLN
INTRAMUSCULAR | Status: DC | PRN
  Administered 2021-09-26: 30 mg via INTRAVENOUS

## 2021-09-26 MED ORDER — ONDANSETRON HCL 4 MG/2ML IJ SOLN
INTRAMUSCULAR | Status: AC
  Filled 2021-09-26: qty 2

## 2021-09-26 MED ORDER — ONDANSETRON HCL 4 MG/2ML IJ SOLN
INTRAMUSCULAR | Status: AC
  Administered 2021-09-26: 4 mg via INTRAVENOUS
  Filled 2021-09-26: qty 2

## 2021-09-26 MED ORDER — DEXMEDETOMIDINE (PRECEDEX) IN NS 20 MCG/5ML (4 MCG/ML) IV SYRINGE
PREFILLED_SYRINGE | INTRAVENOUS | Status: DC | PRN
  Administered 2021-09-26 (×2): 4 ug via INTRAVENOUS

## 2021-09-26 MED ORDER — FAMOTIDINE 20 MG PO TABS
ORAL_TABLET | ORAL | Status: AC
  Administered 2021-09-26: 20 mg via ORAL
  Filled 2021-09-26: qty 1

## 2021-09-26 MED ORDER — ACETAMINOPHEN 500 MG PO TABS: 1000.0000 mg | ORAL_TABLET | Freq: Three times a day (TID) | ORAL | 2 refills | Status: AC

## 2021-09-26 MED ORDER — SODIUM CHLORIDE 0.9 % IV SOLN
INTRAVENOUS | Status: AC | PRN
  Administered 2021-09-26: 200 mL

## 2021-09-26 MED ORDER — DEXAMETHASONE SODIUM PHOSPHATE 10 MG/ML IJ SOLN
INTRAMUSCULAR | Status: DC | PRN
  Administered 2021-09-26: 5 mg via INTRAVENOUS

## 2021-09-26 MED ORDER — MIDAZOLAM HCL 2 MG/2ML IJ SOLN
INTRAMUSCULAR | Status: AC
  Administered 2021-09-26: 1 mg via INTRAVENOUS
  Filled 2021-09-26: qty 2

## 2021-09-26 MED ORDER — ONDANSETRON HCL 4 MG/2ML IJ SOLN
4.0000 mg | Freq: Once | INTRAMUSCULAR | Status: AC
  Administered 2021-09-26: 4 mg via INTRAVENOUS

## 2021-09-26 MED ORDER — VANCOMYCIN HCL 1000 MG IV SOLR
INTRAVENOUS | Status: DC | PRN
  Administered 2021-09-26: 1000 mg

## 2021-09-26 MED ORDER — BUPIVACAINE HCL (PF) 0.5 % IJ SOLN
INTRAMUSCULAR | Status: DC | PRN
  Administered 2021-09-26: 30 mL

## 2021-09-26 MED ORDER — FENTANYL CITRATE (PF) 100 MCG/2ML IJ SOLN
INTRAMUSCULAR | Status: AC
  Filled 2021-09-26: qty 2

## 2021-09-26 MED ORDER — ROCURONIUM BROMIDE 100 MG/10ML IV SOLN
INTRAVENOUS | Status: DC | PRN
  Administered 2021-09-26: 50 mg via INTRAVENOUS
  Administered 2021-09-26: 20 mg via INTRAVENOUS
  Administered 2021-09-26: 30 mg via INTRAVENOUS
  Administered 2021-09-26: 20 mg via INTRAVENOUS

## 2021-09-26 MED ORDER — EPINEPHRINE PF 1 MG/ML IJ SOLN
INTRAMUSCULAR | Status: AC
  Filled 2021-09-26: qty 4

## 2021-09-26 MED ORDER — CHLORHEXIDINE GLUCONATE 0.12 % MT SOLN: 15.0000 mL | Freq: Once | OROMUCOSAL | Status: AC

## 2021-09-26 MED ORDER — BUPIVACAINE HCL (PF) 0.5 % IJ SOLN
INTRAMUSCULAR | Status: AC
  Filled 2021-09-26: qty 30

## 2021-09-26 MED ORDER — ASPIRIN 325 MG PO TBEC: 325.0000 mg | DELAYED_RELEASE_TABLET | Freq: Every day | ORAL | 0 refills | Status: AC

## 2021-09-26 MED ORDER — FAMOTIDINE 20 MG PO TABS: 20.0000 mg | ORAL_TABLET | Freq: Once | ORAL | Status: AC

## 2021-09-26 MED ORDER — PROPOFOL 10 MG/ML IV BOLUS
INTRAVENOUS | Status: AC
  Filled 2021-09-26: qty 40

## 2021-09-26 MED ORDER — MIDAZOLAM HCL 2 MG/2ML IJ SOLN
INTRAMUSCULAR | Status: AC
  Filled 2021-09-26: qty 2

## 2021-09-26 MED ORDER — ORAL CARE MOUTH RINSE: 15.0000 mL | Freq: Once | OROMUCOSAL | Status: AC

## 2021-09-26 MED ORDER — SUGAMMADEX SODIUM 200 MG/2ML IV SOLN
INTRAVENOUS | Status: DC | PRN
  Administered 2021-09-26: 200 mg via INTRAVENOUS

## 2021-09-26 MED ORDER — 0.9 % SODIUM CHLORIDE (POUR BTL) OPTIME
TOPICAL | Status: DC | PRN
  Administered 2021-09-26: 500 mL

## 2021-09-26 MED ORDER — MIDAZOLAM HCL 2 MG/2ML IJ SOLN
INTRAMUSCULAR | Status: DC | PRN
  Administered 2021-09-26: 2 mg via INTRAVENOUS
  Administered 2021-09-26 (×2): 1 mg via INTRAVENOUS

## 2021-09-26 MED ORDER — CHLORHEXIDINE GLUCONATE 0.12 % MT SOLN
OROMUCOSAL | Status: AC
  Administered 2021-09-26: 15 mL via OROMUCOSAL
  Filled 2021-09-26: qty 15

## 2021-09-26 MED ORDER — OXYCODONE HCL 5 MG PO TABS
ORAL_TABLET | ORAL | Status: AC
  Administered 2021-09-26: 5 mg via ORAL
  Filled 2021-09-26: qty 1

## 2021-09-26 MED ORDER — DIAZEPAM 5 MG PO TABS: 5.0000 mg | ORAL_TABLET | Freq: Three times a day (TID) | ORAL | 0 refills | Status: AC | PRN

## 2021-09-26 MED ORDER — GABAPENTIN 300 MG PO CAPS: 300.0000 mg | ORAL_CAPSULE | Freq: Three times a day (TID) | ORAL | 0 refills | Status: AC

## 2021-09-26 MED ORDER — FENTANYL CITRATE (PF) 100 MCG/2ML IJ SOLN
INTRAMUSCULAR | Status: DC | PRN
  Administered 2021-09-26 (×2): 50 ug via INTRAVENOUS

## 2021-09-26 MED ORDER — LACTATED RINGERS IV SOLN
INTRAVENOUS | Status: DC | PRN
  Administered 2021-09-26: 4 mL

## 2021-09-26 MED ORDER — OXYCODONE HCL 5 MG PO TABS: 5.0000 mg | ORAL_TABLET | Freq: Once | ORAL | Status: AC | PRN

## 2021-09-26 MED ORDER — IBUPROFEN 800 MG PO TABS: 800.0000 mg | ORAL_TABLET | Freq: Three times a day (TID) | ORAL | 0 refills | Status: AC

## 2021-09-26 MED ORDER — OXYCODONE HCL 5 MG PO TABS: 5.0000 mg | ORAL_TABLET | ORAL | 0 refills | Status: AC | PRN

## 2021-09-26 MED ORDER — ONDANSETRON HCL 4 MG/2ML IJ SOLN: 4.0000 mg | Freq: Once | INTRAMUSCULAR | Status: AC | PRN

## 2021-09-26 SURGICAL SUPPLY — 90 items
ADAPTER IRRIG TUBE 2 SPIKE SOL (ADAPTER) ×4 IMPLANT
ANCHOR BUTTON TIGHTROPE 14 (Anchor) ×1 IMPLANT
BLADE SHAVER 4.5X7 STR FR (MISCELLANEOUS) ×2 IMPLANT
BLADE SURG 15 STRL LF DISP TIS (BLADE) ×3 IMPLANT
BLADE SURG 15 STRL SS (BLADE) ×6
BLADE SURG SZ10 CARB STEEL (BLADE) ×2 IMPLANT
BLADE SURG SZ11 CARB STEEL (BLADE) ×2 IMPLANT
BNDG COHESIVE 4X5 TAN ST LF (GAUZE/BANDAGES/DRESSINGS) ×2 IMPLANT
BNDG COHESIVE 6X5 TAN ST LF (GAUZE/BANDAGES/DRESSINGS) ×2 IMPLANT
BNDG ESMARK 6X12 TAN STRL LF (GAUZE/BANDAGES/DRESSINGS) ×2 IMPLANT
BRUSH SCRUB EZ  4% CHG (MISCELLANEOUS) ×2
BRUSH SCRUB EZ 4% CHG (MISCELLANEOUS) ×1 IMPLANT
BUR BR 5.5 WIDE MOUTH (BURR) IMPLANT
CHLORAPREP W/TINT 26 (MISCELLANEOUS) ×4 IMPLANT
COOLER POLAR GLACIER W/PUMP (MISCELLANEOUS) ×2 IMPLANT
COVER BACK TABLE REUSABLE LG (DRAPES) ×2 IMPLANT
CUFF TOURN SGL QUICK 24 (TOURNIQUET CUFF)
CUFF TOURN SGL QUICK 34 (TOURNIQUET CUFF)
CUFF TRNQT CYL 24X4X16.5-23 (TOURNIQUET CUFF) IMPLANT
CUFF TRNQT CYL 34X4.125X (TOURNIQUET CUFF) IMPLANT
DERMABOND ADVANCED (GAUZE/BANDAGES/DRESSINGS) ×1
DERMABOND ADVANCED .7 DNX12 (GAUZE/BANDAGES/DRESSINGS) ×1 IMPLANT
DEVICE MENISCAL CVD UP (Anchor) ×6 IMPLANT
DRAPE 3/4 80X56 (DRAPES) ×2 IMPLANT
DRAPE ARTHRO LIMB 89X125 STRL (DRAPES) ×2 IMPLANT
DRAPE FLUOR MINI C-ARM 54X84 (DRAPES) ×2 IMPLANT
DRAPE IMP U-DRAPE 54X76 (DRAPES) ×2 IMPLANT
DRAPE POUCH INSTRU U-SHP 10X18 (DRAPES) ×2 IMPLANT
DRILL FLIPCUTTER III 6-12 (ORTHOPEDIC DISPOSABLE SUPPLIES) IMPLANT
ELECT REM PT RETURN 9FT ADLT (ELECTROSURGICAL) ×2
ELECTRODE REM PT RTRN 9FT ADLT (ELECTROSURGICAL) ×1 IMPLANT
FLIPCUTTER III 6-12 AR-1204FF (ORTHOPEDIC DISPOSABLE SUPPLIES) ×2
GAUZE SPONGE 4X4 12PLY STRL (GAUZE/BANDAGES/DRESSINGS) ×2 IMPLANT
GAUZE XEROFORM 1X8 LF (GAUZE/BANDAGES/DRESSINGS) ×2 IMPLANT
GLOVE BIOGEL PI IND STRL 8 (GLOVE) ×2 IMPLANT
GLOVE BIOGEL PI INDICATOR 8 (GLOVE) ×2
GLOVE SURG ORTHO 8.0 STRL STRW (GLOVE) ×2 IMPLANT
GLOVE SURG SYN 8.0 (GLOVE) ×2 IMPLANT
GLOVE SURG SYN 8.0 PF PI (GLOVE) ×1 IMPLANT
GOWN STRL REUS W/ TWL LRG LVL3 (GOWN DISPOSABLE) ×1 IMPLANT
GOWN STRL REUS W/ TWL XL LVL3 (GOWN DISPOSABLE) ×1 IMPLANT
GOWN STRL REUS W/TWL LRG LVL3 (GOWN DISPOSABLE) ×2
GOWN STRL REUS W/TWL XL LVL3 (GOWN DISPOSABLE) ×2
GRADUATE 1200CC STRL 31836 (MISCELLANEOUS) ×2 IMPLANT
GUIDEWIRE 1.2MMX18 (WIRE) ×2 IMPLANT
IMP SYS 2ND FIX PEEK 4.75X19.1 (Miscellaneous) ×2 IMPLANT
IMPL SYS 2ND FX PEEK 4.75X19.1 (Miscellaneous) IMPLANT
IMPL TIGHTROP ABS ACL FIBERTG (Orthopedic Implant) IMPLANT
IMPL TIGHTROP FIBERTAG ACL (Orthopedic Implant) IMPLANT
IMPL TIGHTROPE ABS ACL FIBERTG (Orthopedic Implant) ×2 IMPLANT
IMPLANT TIGHTROPE FIBERTAG ACL (Orthopedic Implant) ×2 IMPLANT
IV LACTATED RINGER IRRG 3000ML (IV SOLUTION) ×12
IV LR IRRIG 3000ML ARTHROMATIC (IV SOLUTION) ×6 IMPLANT
KIT TRANSTIBIAL (DISPOSABLE) ×1 IMPLANT
KIT TURNOVER KIT A (KITS) ×2 IMPLANT
MANIFOLD NEPTUNE II (INSTRUMENTS) ×4 IMPLANT
MAT ABSORB  FLUID 56X50 GRAY (MISCELLANEOUS) ×4
MAT ABSORB FLUID 56X50 GRAY (MISCELLANEOUS) ×2 IMPLANT
NDL SUT 2-0 SCORPION KNEE (NEEDLE) IMPLANT
NEEDLE HYPO 22GX1.5 SAFETY (NEEDLE) ×2 IMPLANT
NEEDLE SUT 2-0 SCORPION KNEE (NEEDLE) ×2 IMPLANT
PACK ARTHROSCOPY KNEE (MISCELLANEOUS) ×2 IMPLANT
PAD ABD DERMACEA PRESS 5X9 (GAUZE/BANDAGES/DRESSINGS) ×4 IMPLANT
PAD WRAPON POLAR KNEE (MISCELLANEOUS) ×1 IMPLANT
PADDING CAST BLEND 6X4 STRL (MISCELLANEOUS) IMPLANT
PADDING STRL CAST 6IN (MISCELLANEOUS) ×1
PENCIL SMOKE EVACUATOR (MISCELLANEOUS) ×2 IMPLANT
REAMER LO PROFILE (MISCELLANEOUS) ×1 IMPLANT
SHAVER BLADE BONE CUTTER  5.5 (BLADE)
SHAVER BLADE BONE CUTTER 5.5 (BLADE) IMPLANT
SPONGE T-LAP 18X18 ~~LOC~~+RFID (SPONGE) ×6 IMPLANT
SUT ETHILON 3-0 FS-10 30 BLK (SUTURE) ×2
SUT FIBERSNARE 2 CLSD LOOP (SUTURE) ×2 IMPLANT
SUT FIBERWIRE #2 38 T-5 BLUE (SUTURE) ×4
SUT FIBERWIRE 2-0 18 17.9 3/8 (SUTURE) ×2
SUT MNCRL AB 4-0 PS2 18 (SUTURE) ×4 IMPLANT
SUT VIC AB 0 CT1 36 (SUTURE) ×3 IMPLANT
SUT VIC AB 2-0 CT2 27 (SUTURE) ×4 IMPLANT
SUTURE EHLN 3-0 FS-10 30 BLK (SUTURE) ×1 IMPLANT
SUTURE FIBERWR #2 38 T-5 BLUE (SUTURE) ×2 IMPLANT
SUTURE FIBERWR 2-0 18 17.9 3/8 (SUTURE) IMPLANT
SYR BULB IRRIG 60ML STRL (SYRINGE) ×2 IMPLANT
SYS ANCHOR SUT W/2-0 BLU CO-BR (Anchor) IMPLANT
TAPE LABRALWHITE 1.5X36 (TAPE) ×1 IMPLANT
TRAY FOLEY SLVR 16FR LF STAT (SET/KITS/TRAYS/PACK) ×1 IMPLANT
TUBING INFLOW SET DBFLO PUMP (TUBING) ×2 IMPLANT
TUBING OUTFLOW SET DBLFO PUMP (TUBING) ×2 IMPLANT
WAND WEREWOLF FLOW 90D (MISCELLANEOUS) ×1 IMPLANT
WATER STERILE IRR 500ML POUR (IV SOLUTION) ×2 IMPLANT
WRAPON POLAR PAD KNEE (MISCELLANEOUS) ×2

## 2021-09-26 NOTE — Discharge Instructions (Addendum)
Arthroscopic ACL Surgery with Meniscus Repair   Post-Op Instructions   1. Bracing or crutches: Crutches will be provided at the time of discharge from the surgery center.    2. Ice: You may be provided with a device [facility]) that allows you to ice the affected area effectively. Otherwise you can ice manually.   3. Driving:  Driving: Off all narcotic pain meds when operating vehicle   1 week for automatic cars, left leg surgery  4 weeks for standard/manual cars or right leg surgery   4. Activity: Ankle pumps several times an hour while awake to prevent blood clots. Weight bearing: 50% weight bearing is permitted with brace locked in extension (weight of leg for balance only -- must use arms for support when operative leg is on the ground). The brace should not be unlocked in order to protect the meniscus repair. Unlock only for hygiene and for exercises as directed by physical therapist. Elevate knee above heart level as much as possible for one week. Avoid standing more than 5 minutes (consecutively) for the first week. No exercise involving the knee until cleared by the surgeon or physical therapist. Ideally, you should avoid long distance travel for 4 weeks.   5. Medications:  - You have been provided a prescription for narcotic pain medicine (oxycodone). After surgery, take 1-2 narcotic tablets every 4 hours if needed for severe pain.  - A prescription for anti-nausea medication (Zofran) will be provided in case the narcotic medicine causes nausea - take 1 tablet every 6 hours only if nauseated.  - Take ibuprofen 800 mg every 8 hours with food (scheduled) to reduce post-operative knee swelling. DO NOT STOP IBUPROFEN POST-OP UNTIL INSTRUCTED TO DO SO at first post-op office visit (10-14 days after surgery).  - Take enteric coated aspirin 325 mg once daily for 4 weeks to prevent blood clots.  -Take tylenol 1000 mg every 8 hours for pain (scheduled).  May stop tylenol ~1-2 weeks after surgery  if you are having minimal pain. - Take gabapentin 300 mg three times daily for 5 days (scheduled) - Take Valium 5mg  three times daily as needed x 2 weeks. May stop if not having any significant muscle spasms.  If you are taking prescription medication for anxiety, depression, insomnia, muscle spasm, chronic pain, or for attention deficit disorder you are advised that you are at a higher risk of adverse effects with use of narcotics post-op, including narcotic addiction/dependence, depressed breathing, death. If you use non-prescribed substances: alcohol, marijuana, cocaine, heroin, methamphetamines, etc., you are at a higher risk of adverse effects with use of narcotics post-op, including narcotic addiction/dependence, depressed breathing, death. You are advised that taking > 50 morphine milligram equivalents (MME) of narcotic pain medication per day results in twice the risk of overdose or death. For your prescription provided: oxycodone 5 mg - taking more than 6 tablets per day. Be advised that we will prescribe narcotics short-term, for acute post-operative pain only - 1 week for minor operations such as knee arthroscopy for meniscus tear resection, and 3 weeks for major operations such as knee repair/reconstruction surgeries.   6. Bandages: The physical therapist should change the bandages at the first post-op appointment. If needed, the dressing supplies have been provided to you.   7. Physical Therapy: 2 times per week for the first 4 weeks, then 1-2 times per week from weeks 4-8 post-op. Therapy typically starts within 1 week. You have been provided an order for physical therapy. The therapist  will provide home exercises. Attending physical therapy and performing the appropriate exercises on your own at home daily will determine how well you do after this surgery. If you do not know when your first physical therapy appointment is, please call the office at [phone]. 8. Work/School: May return  when able to tolerate standing for greater than 2 hours and off of narcotic pain medications. Can return to school usually in ~1-2 weeks.    9. Post-Op Appointments: Your first post-op appointment will be with Dr. Allena Katz in approximately 2 weeks time.    If you find that they have not been scheduled please call the Orthopaedic Appointment front desk at [phone].     AMBULATORY SURGERY  DISCHARGE INSTRUCTIONS   The drugs that you were given will stay in your system until tomorrow so for the next 24 hours you should not:  Drive an automobile Make any legal decisions Drink any alcoholic beverage   You may resume regular meals tomorrow.  Today it is better to start with liquids and gradually work up to solid foods.  You may eat anything you prefer, but it is better to start with liquids, then soup and crackers, and gradually work up to solid foods.   Please notify your doctor immediately if you have any unusual bleeding, trouble breathing, redness and pain at the surgery site, drainage, fever, or pain not relieved by medication.    Additional Instructions:        Please contact your physician with any problems or Same Day Surgery at [phone], Monday through Friday 6 am to 4 pm, or [place] at [facility] number at [phone].

## 2021-09-26 NOTE — Anesthesia Procedure Notes (Signed)
Anesthesia Regional Block: Other (iPACK)   Pre-Anesthetic Checklist: , timeout performed,  Correct Patient, Correct Site, Correct Laterality,  Correct Procedure,, site marked,  Risks and benefits discussed,  Surgical consent,  Pre-op evaluation,  At surgeon's request and post-op pain management  Laterality: Left  Prep: chloraprep       Needles:  Injection technique: Single-shot  Needle Type: Stimiplex          Additional Needles:   Procedures:,,,, ultrasound used (permanent image in chart),,   Motor weakness within 20 minutes.  Narrative:  Start time: 09/26/2021 7:26 AM End time: 09/26/2021 7:27 AM Injection made incrementally with aspirations every 5 mL.  Performed by: Personally  Anesthesiologist: Reed Breech, MD  Additional Notes: Functioning IV was confirmed and monitors applied.  Sterile prep and drape, hand hygiene and sterile gloves were used. Ultrasound guidance: relevant anatomy identified, needle position confirmed, local anesthetic spread visualized in field, vascular puncture avoided.  Image saved to electronic medical record.  Negative aspiration prior to incremental administration of local anesthetic for total 10 ml bupivacaine 0.5% given in iPACK distribution (2 mL increments in 5 locations along femur). The patient tolerated the procedure well. Vital signs and moderate sedation medications recorded in RN notes.

## 2021-09-26 NOTE — Anesthesia Procedure Notes (Signed)
Anesthesia Regional Block: Adductor canal block   Pre-Anesthetic Checklist: , timeout performed,  Correct Patient, Correct Site, Correct Laterality,  Correct Procedure,, risks and benefits discussed,  Surgical consent,  Pre-op evaluation,  At surgeon's request and post-op pain management  Laterality: Left  Prep: chloraprep       Needles:  Injection technique: Single-shot  Needle Type: Stimiplex          Additional Needles:   Procedures:,,,, ultrasound used (permanent image in chart),,   Motor weakness within 20 minutes.  Narrative:  Start time: 09/26/2021 7:29 AM End time: 09/26/2021 7:30 AM Injection made incrementally with aspirations every 5 mL.  Performed by: Personally  Anesthesiologist: Reed Breech, MD  Additional Notes: Functioning IV was confirmed and monitors applied.  Sterile prep and drape, hand hygiene and sterile gloves were used. Ultrasound guidance: relevant anatomy identified, needle position confirmed, local anesthetic spread visualized around nerve(s), vascular puncture avoided.  Image saved to electronic medical record.  Negative aspiration prior to incremental administration of local anesthetic for total 20 ml bupivacaine 0.5% given in adductor saphenous distribution. The patient tolerated the procedure well. Vital signs and moderate sedation medications recorded in RN notes.

## 2021-09-26 NOTE — Anesthesia Procedure Notes (Signed)
Procedure Name: Intubation Date/Time: 09/26/2021 7:48 AM  Performed by: Lorie Apley, CRNAPre-anesthesia Checklist: Patient identified, Patient being monitored, Timeout performed, Emergency Drugs available and Suction available Patient Re-evaluated:Patient Re-evaluated prior to induction Oxygen Delivery Method: Circle system utilized Preoxygenation: Pre-oxygenation with 100% oxygen Induction Type: IV induction Ventilation: Mask ventilation without difficulty Laryngoscope Size: Mac and 3 Grade View: Grade I Tube type: Oral Tube size: 7.0 mm Number of attempts: 1 Airway Equipment and Method: Stylet Placement Confirmation: ETT inserted through vocal cords under direct vision, positive ETCO2 and breath sounds checked- equal and bilateral Secured at: 21 cm Tube secured with: Tape Dental Injury: Teeth and Oropharynx as per pre-operative assessment

## 2021-09-26 NOTE — Anesthesia Preprocedure Evaluation (Addendum)
Anesthesia Evaluation  Patient identified by MRN, date of birth, ID band Patient awake    Reviewed: Allergy & Precautions, NPO status , Patient's Chart, lab work & pertinent test results  History of Anesthesia Complications (+) Emergence Delirium and history of anesthetic complications  Airway Mallampati: I   Neck ROM: Full    Dental  (+) Missing, Chipped   Pulmonary former smoker (quit e cigarettes few months ago),    Pulmonary exam normal breath sounds clear to auscultation       Cardiovascular Exercise Tolerance: Good negative cardio ROS Normal cardiovascular exam Rhythm:Regular Rate:Normal     Neuro/Psych negative neurological ROS     GI/Hepatic negative GI ROS,   Endo/Other  Obesity; hx gestational DM  Renal/GU negative Renal ROS     Musculoskeletal   Abdominal   Peds  Hematology negative hematology ROS (+)   Anesthesia Other Findings   Reproductive/Obstetrics                            Anesthesia Physical Anesthesia Plan  ASA: 2  Anesthesia Plan: General and Regional   Post-op Pain Management: Regional block*   Induction: Intravenous  PONV Risk Score and Plan: 3 and Ondansetron, Dexamethasone and Treatment may vary due to age or medical condition  Airway Management Planned: Oral ETT  Additional Equipment:   Intra-op Plan:   Post-operative Plan: Extubation in OR  Informed Consent: I have reviewed the patients History and Physical, chart, labs and discussed the procedure including the risks, benefits and alternatives for the proposed anesthesia with the patient or authorized representative who has indicated his/her understanding and acceptance.     Dental advisory given  Plan Discussed with: CRNA  Anesthesia Plan Comments: (Plan for preoperative adductor saphenous and iPACK nerve blocks and GETA.  Patient consented for risks of anesthesia including but not limited  to:  - adverse reactions to medications - damage to eyes, teeth, lips or other oral mucosa - nerve damage due to positioning  - sore throat or hoarseness - damage to heart, brain, nerves, lungs, other parts of body or loss of life  Informed patient about role of CRNA in peri- and intra-operative care.  Patient voiced understanding.)       Anesthesia Quick Evaluation

## 2021-09-26 NOTE — H&P (Signed)
Paper H&P to be scanned into permanent record. H&P reviewed. No significant changes noted.  

## 2021-09-26 NOTE — Transfer of Care (Signed)
Immediate Anesthesia Transfer of Care Note  Patient: Meagan Armstrong  Procedure(s) Performed: Left arthroscopic ACL reconstruction using quadriceps tendon autograft, medial meniscus repair, and possible patella chondroplasty (Left: Knee)  Patient Location: PACU  Anesthesia Type:General  Level of Consciousness: awake, alert  and oriented  Airway & Oxygen Therapy: Patient Spontanous Breathing and Patient connected to face mask oxygen  Post-op Assessment: Report given to RN, Post -op Vital signs reviewed and stable and Patient moving all extremities  Post vital signs: Reviewed and stable  Last Vitals:  Vitals Value Taken Time  BP 133/76 09/26/21 1033  Temp 36.4 C 09/26/21 1033  Pulse 89 09/26/21 1040  Resp 18 09/26/21 1040  SpO2 100 % 09/26/21 1040  Vitals shown include unvalidated device data.  Last Pain:  Vitals:   09/26/21 0623  TempSrc: Oral  PainSc: 0-No pain         Complications: No notable events documented.

## 2021-09-26 NOTE — Op Note (Addendum)
Operative Note    SURGERY DATE: 06/14/2017   PRE-OP DIAGNOSIS:  1. Left knee anterior cruciate ligament tear 2. Left medial meniscus tear 3. Left patella chondral defect   POST-OP DIAGNOSIS:  1. Left knee anterior cruciate ligament tear 2. Left medial meniscus tear 3. Left patella chondral defect  PROCEDURES:  1. Left knee anterior cruciate ligament reconstruction with quadriceps tendon autograft 2. Left medial meniscus repair 3. Left patella chondroplasty   SURGEON: Rosealee Albee, MD  ASSISTANT: Dedra Skeens, PA   ANESTHESIA: Gen + regional anesthesia (adductor + IPACK)   ESTIMATED BLOOD LOSS: 5cc   TOTAL IV FLUIDS: per anesthesia  INDICATION(S):  The patient is a 31 y.o. female who initially suffered an injury approximately 1 year ago. She had progressive worsening of her symptoms approximately 3 months ago with repeated instability episodes. MRI showed an ACL tear and tear of the medial meniscus, consistent with the clinical exam. After discussion of risks, benefits, and alternatives to surgery, the patient and/or family elected to proceed.     OPERATIVE FINDINGS:    Examination under anesthesia: A careful examination under anesthesia was performed.  Passive range of motion was: Hyperextension: 2.  Extension: 0.  Flexion: 140.  Lachman: 2B. Pivot Shift: grade 2.  Posterior drawer: normal.  Varus stability in full extension: normal.  Varus stability in 30 degrees of flexion: normal.  Valgus stability in full extension: normal.  Valgus stability in 30 degrees of flexion: normal.   Intra-operative findings: A thorough arthroscopic examination of the knee was performed.  The findings are: 1. Suprapatellar pouch: Normal 2. Undersurface of median ridge: Focal grade 3 degenerative change, approximately 10 x 10 mm 3. Medial patellar facet: Grade 1 softening 4. Lateral patellar facet: Grade 1 softening 5. Trochlea: Normal 6. Lateral gutter/popliteus tendon: Normal 7. Hoffa's fat  pad: Normal 8. Medial gutter/plica: Normal 9. ACL: Abnormal: complete femoral avulsion 10. PCL: Normal 11. Medial meniscus: undersurface vertical tear of the posterior horn at the periphery 12. Medial compartment cartilage: Normal 13. Lateral meniscus: normal 14. Lateral compartment cartilage: Grade 1-2 degenerative change to the tibial plateau measuring approximately 5 x 5 mm; normal femoral condyle   OPERATIVE REPORT:     I identified Reyah Streeter in the pre-operative holding area.  I marked the operative knee with my initials. I reviewed the risks and benefits of the proposed surgical intervention and the patient (and/or patient's guardian) wished to proceed.  Anesthesia was then perform with regional anesthesia.  The patient was transferred to the operative suite and placed in the supine position with all bony prominences padded.     Appropriate IV antibiotics were administered within 30 minutes before incision. The extremity was then prepped and draped in standard fashion. A time out was performed confirming the correct extremity, correct patient and correct procedure.   Given the clear presence of a pivot shift on examination under anesthesia, I first directed my attention to the harvest of a quadriceps autograft.  The right lower extremity was exsanguinated with an Esmarch, and a thigh tourniquet was elevated to 250 mmHg.  The total tourniquet time for this case was 130 minutes.     A 4 cm incision was planned just proximal to the proximal pole of the patella.  The incision was made with a 15 blade, and subcutaneous fat was sharply excised to expose the quadriceps tendon.  The peritenon was sharply incised, and the space in between the peritenon and the quadriceps tendon was bluntly developed with  a sponge and a key elevator.  A speculum retractor was placed anteriorly, and the quadriceps was easily visualized with the arthroscope.  The vastus lateralis and VMO were clearly identified, as  was the junction of the rectus femoris muscle with the proximal aspect of the quadriceps tendon.  Under direct visualization with the arthroscope, an Arthrex 17mm parallel blade was used to incise the quadriceps tendon from its most proximal extent, to the junction with the patella.  Care was taken not to violate the rectus femoris muscle.  Then, using a 15 blade, the graft was transected and elevated distally off the patella, creating a 70mm thick partial thickness graft.  Dissection was carried proximally to create a 29mm graft.  The distal end of the graft was controlled with a #2 Fiberwire stitch, and the Arthrex quadriceps harvester/cutter was loaded over the graft.  At a length of 81mm, the harvest/cutter was used to transect the graft proximally, and the graft was removed from the wound.  There were 2 small regions where the capsule had been violated and these were repaired with 0 Vicryl sutures.   On the back table, the graft was prepared in standard fashion.  The length of the graft was 54mm.  Each end was prepared using an Warden/ranger.  The femoral end was secured around a TightRope RT, and the tibial end was secured around an ABS loop.  The femoral end of the graft was 32mm in diameter, the tibial end was 21mm in diameter.  The graft was tensioned to 20 lbs and reserved for later use.  Additionally, it was soaked in vancomycin 5 mg/mL prior to implantation to reduce the chance of infection.   Standard anterolateral portals was created with an 11-blade.  The arthroscope was introduced through the anterolateral portal, and a full diagnostic arthroscopy was performed as described above.   An anteromedial portal was made under needle localization. A shaver was introduced through the anteromedial portal and used to gently debride the fat pad to improve visualization. Then the ACL remnant was debrided using the shaver, leaving 1-2 mm stumps on the tibia for anatomic referencing.  A chondroplasty  of the patella was performed using an oscillating shaver such that there were stable cartilaginous edges.  The medial meniscus was addressed next. A shaver and rasp were used to roughen the capsular tissue about the meniscus. Two Stryker IVY Air all-inside suture devices were used to repair the meniscus tear. These were both placed on the tibial side in a vertical mattress fashion. The tear was probed afterwards and found to be stable and anatomically reduced.  Of note, on reexamination at the end of the case one of the sutures had loosened so it was removed and another stitch was placed in a similar fashion.  This allowed for a stable, anatomic repair.   Next, I created the femoral socket. This was performed with an outside-in technique using an Teacher, English as a foreign language. The femoral guide was hooked around the back wall of the femur and the guide indicated where the lateral femoral incision should be. We made this incision with a 15 blade and followed the angle of the drill sleeve to make the incision through the IT band down to bone. The drill sleeve was pushed down to bone on the lateral femoral condyle and the guide was placed on the anatomic footprint of the ACL. We drilled a 61mm tunnel that was 61mm in length. We then used a FiberStick to pass a suture through  the femoral tunnel and out of the anteromedial portal.    I then directed my attention to preparation of the tibial tunnel. A tibial guide set at 60 degrees was inserted through the anteromedial portal and centered over the tibial footprint.  The drill sleeve was then advanced to the proximal medial tibia just at the junction of the tibial tubercle and the pes tendon, through a ~4 cm incision.  The anticipated tunnel length was 43 mm.  A guide pin was then drilled through the proximal tibia under direct arthroscopic visualization into the center of the ACL footprint.  This was then over-reamed with an Arthrex 21mm reamer.  Bony debris and soft tissue was  cleared from the metaphyseal and intra-articular aperture of the tunnels with a shaver.   The graft was then advanced into place in standard fashion.  The femoral Tight Rope was deployed on the lateral cortex under direct arthroscopic visualization from the anteromedial portal.  Correct position on the lateral cortex was confirmed fluoroscopically.  The TightRope was then shortened until at least 20 mm of graft was in the femoral tunnel.  There was some fraying of the fibers.  A knee scorpion was used to place 2 stitches around the ACL graft to better incorporate the graft fibers and arthroscopic knots were tied.  This nicely secured the loose fibers to the ACL graft.   I then directed my attention to tibial fixation.  This was performed with the knee in full extension with an axial and posterior drawer load applied to the tibia.  A 7mm ABS concave button was loaded over the ABS loop, and the loop was shortened until the button was flush with the anteromedial tibial cortex. The knee was then cycled 20 times, and both the femoral and tibial button were tightened as much as possible with the knee in full extension. The tibial sutures were tightened. A hole for a 4.75 mm SwiveLock was drilled approximately 2 cm distal to the tibial tunnel.  The ends of the tibial sutures along with the internal brace construct were placed under tension and the anchor was advanced.  This served as a back-up tibial fixation. These sutures were cut. Similarly the femoral sutures were tightened and cut.   A repeat examination under anesthesia was performed.  The patient retained a full  hyperextension and flexion.  The Lachman's was normalized.  The arthroscope was re-introduced into the knee joint, confirming excellent position and tension of the quadriceps autograft.  There was no lateral wall or roof impingement.   The wounds were irrigated. 2-0 Vicryl was used to close the subdermal layer of quadriceps tendon harvest incision  and the proximal tibia incision. 4-0 Monocryl with Dermabond was used in a running subcuticular incision for the skin of both of these incisions.  The arthroscopy portals and lateral femoral incision were closed with 3-0 Nylon.  A sterile dressing was applied, followed by a Polar Care device and a hinged knee brace locked in full extension.   The patient was awakened from anesthesia without difficulty and was transferred to the PACU in stable condition.  Of note, assistance from a PA was essential to performing the surgery.  PA was present for the entire surgery.  PA assisted with patient positioning, retraction, instrumentation, and wound closure. The surgery would have been more difficult and had longer operative time without PA assistance.    POSTOPERATIVE PLAN: The patient will be discharged home today once they meet PACU criteria. 50% WB x 4  weeks. WBAT with crutches from weeks 4-6. Aspirin 325 mg daily for 4 weeks for DVT prophylaxis.  Physical therapy on POD#3-4.  Follow up in 2 weeks per protocol.

## 2021-09-26 NOTE — Anesthesia Postprocedure Evaluation (Signed)
Anesthesia Post Note  Patient: Meagan Armstrong  Procedure(s) Performed: Left arthroscopic ACL reconstruction using quadriceps tendon autograft, medial meniscus repair, and possible patella chondroplasty (Left: Knee)  Patient location during evaluation: PACU Anesthesia Type: General Level of consciousness: awake and alert, oriented and patient cooperative Pain management: pain level controlled Vital Signs Assessment: post-procedure vital signs reviewed and stable Respiratory status: spontaneous breathing, nonlabored ventilation and respiratory function stable Cardiovascular status: blood pressure returned to baseline and stable Postop Assessment: adequate PO intake Anesthetic complications: no   No notable events documented.   Last Vitals:  Vitals:   09/26/21 1115 09/26/21 1122  BP: (!) 145/84   Pulse: 93 76  Resp: 20 15  Temp:    SpO2: 100% (!) 88%    Last Pain:  Vitals:   09/26/21 1122  TempSrc:   PainSc: Asleep                 Reed Breech

## 2021-09-28 ENCOUNTER — Encounter: Payer: Self-pay | Admitting: Orthopedic Surgery

## 2024-05-02 IMAGING — MR MR KNEE*L* W/O CM
7 series · 40 of 40 positions shown · non-contrast
Comparison: Plain films left knee 07/20/2021.

CLINICAL DATA: Left knee pain and buckling since the patient did
jumping jacks 9-12 months ago.

EXAM:
MRI OF THE LEFT KNEE WITHOUT CONTRAST
TECHNIQUE: Multiplanar, multisequence MR imaging of the knee was performed. No
intravenous contrast was administered.

[Series 4: T2 fat-sat · axial · left · 4.0mm · 0.50mm/px · z∈[-76,+64]mm · 6 of 33 slices shown (1 of 3)]
[im 1/33]
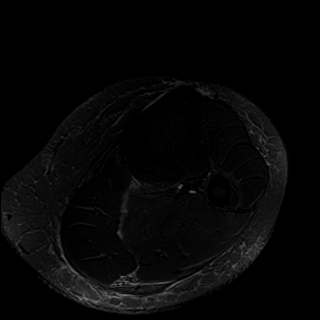
[im 7/33]
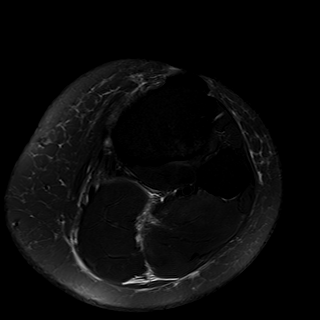
[im 13/33]
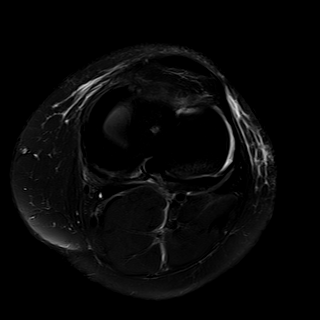
[im 20/33]
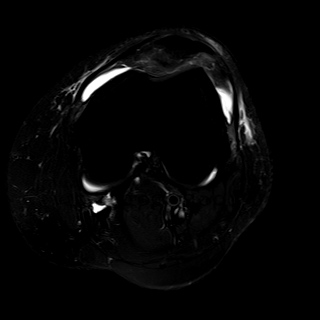
[im 26/33]
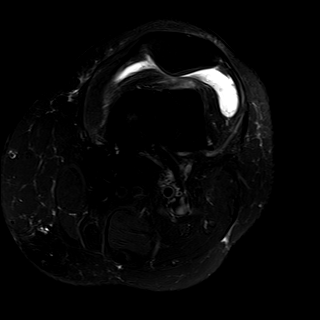
[im 33/33]
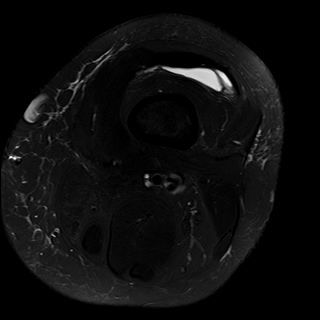

[Series 5: T2 fat-sat · coronal · left · 4.0mm · 0.47mm/px · 5 of 27 slices shown (2 of 3)]
[im 1/27]
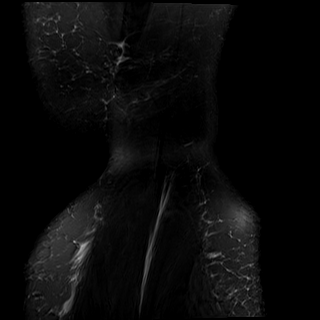
[im 7/27]
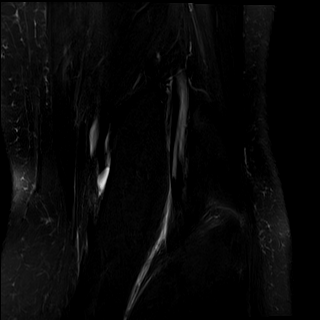
[im 14/27]
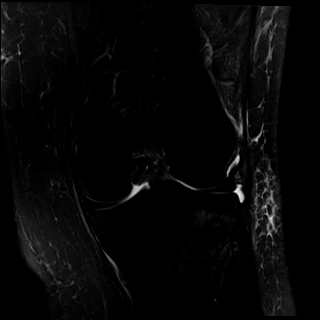
[im 20/27]
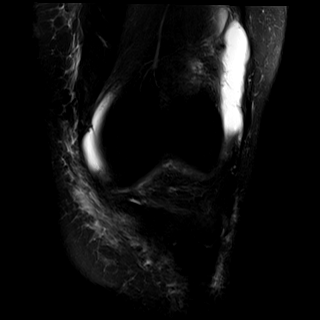
[im 27/27]
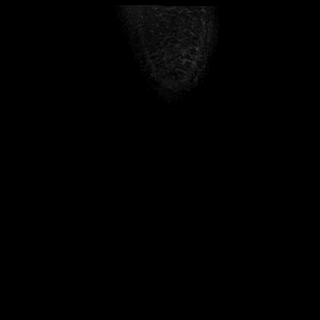

[Series 6: T1 · coronal · left · 4.0mm · 0.47mm/px · 6 of 27 slices shown]
[im 1/27]
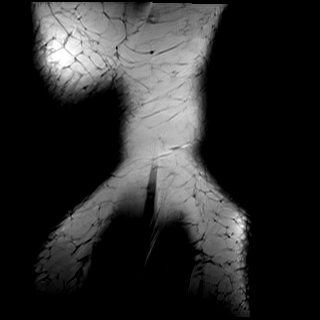
[im 6/27]
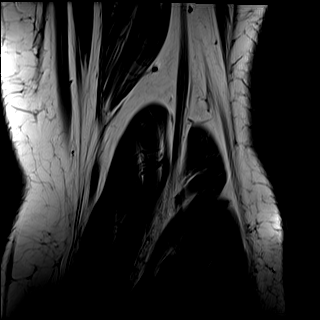
[im 11/27]
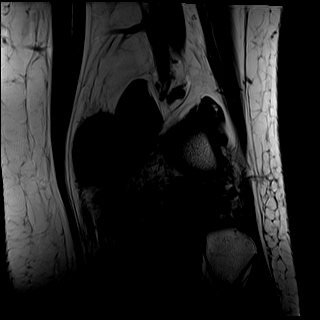
[im 16/27]
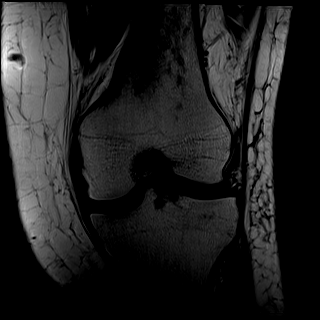
[im 21/27]
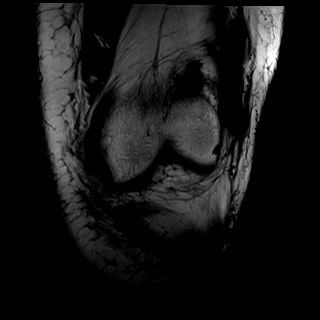
[im 27/27]
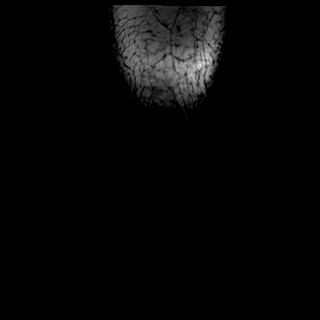

[Series 7: PD fat-sat · coronal · left · 3.0mm · 0.47mm/px · 7 of 32 slices shown (1 of 2)]
[im 1/32]
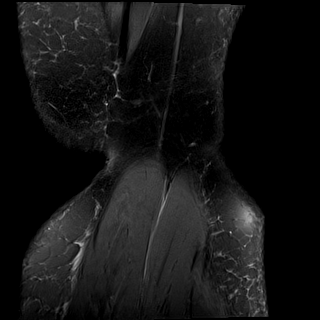
[im 6/32]
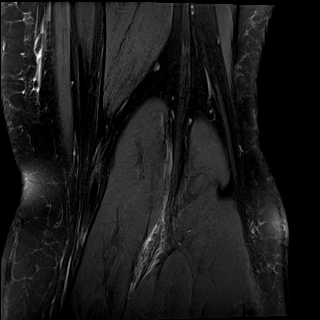
[im 11/32]
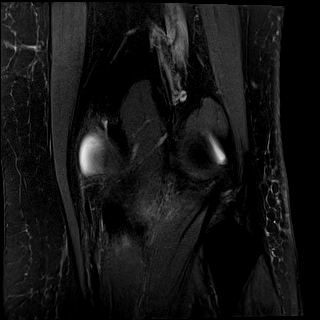
[im 16/32]
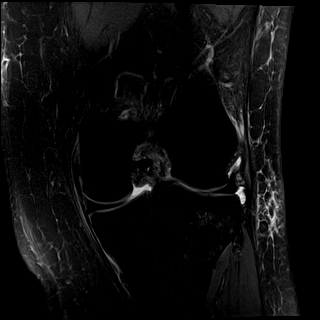
[im 21/32]
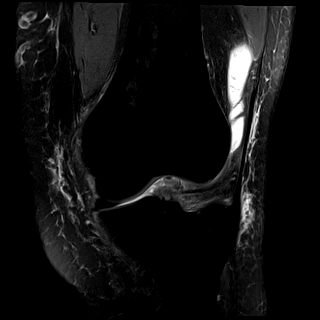
[im 26/32]
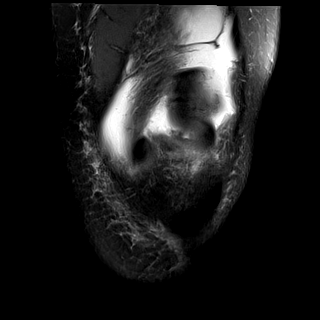
[im 32/32]
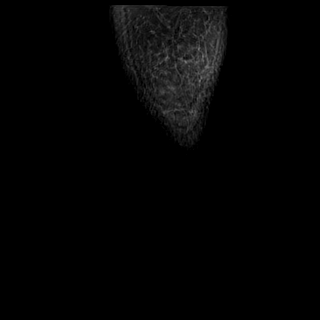

[Series 8: PD fat-sat · sagittal · left · 3.0mm · 0.47mm/px · 6 of 27 slices shown (2 of 2)]
[im 1/27]
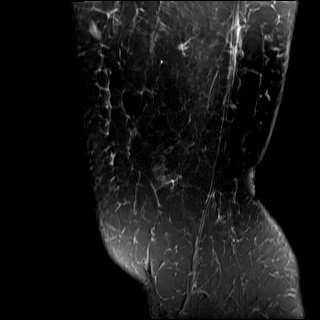
[im 6/27]
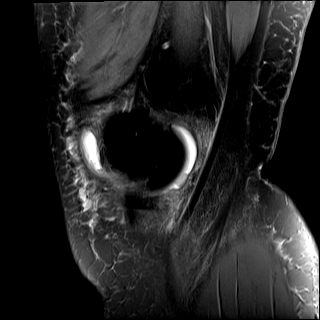
[im 11/27]
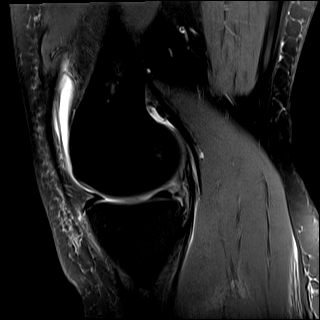
[im 16/27]
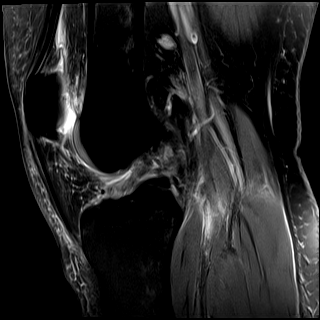
[im 21/27]
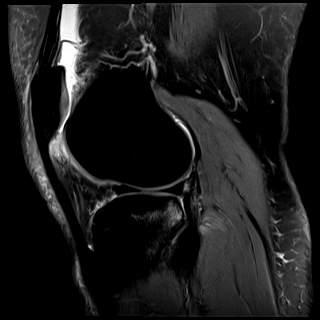
[im 27/27]
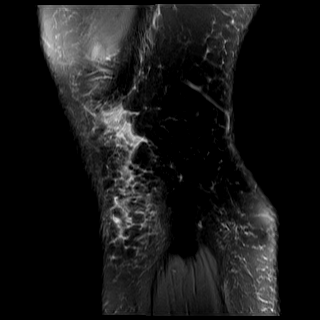

[Series 9: T2 fat-sat · sagittal · left · 3.0mm · 0.47mm/px · 6 of 27 slices shown (3 of 3)]
[im 1/27]
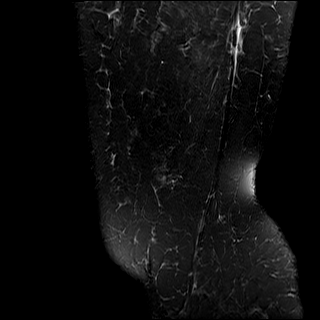
[im 6/27]
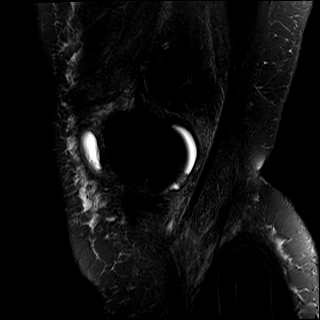
[im 11/27]
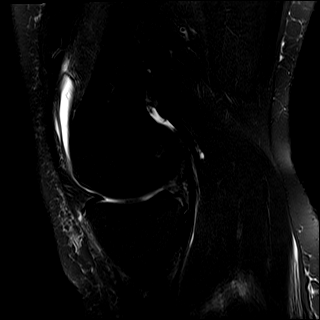
[im 16/27]
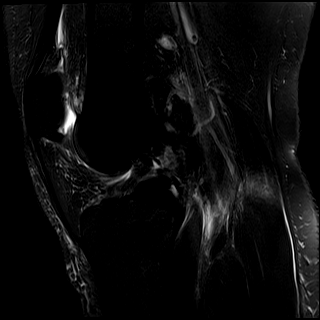
[im 21/27]
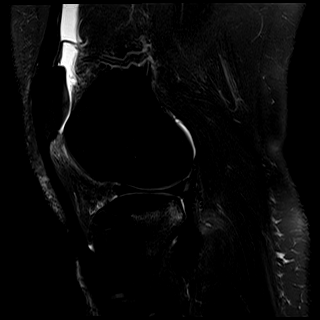
[im 27/27]
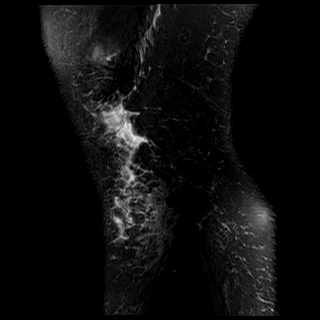

[Series 10: PD · oblique · left · 1.5mm · 0.44mm/px · 4 of 21 slices shown]
[im 1/21]
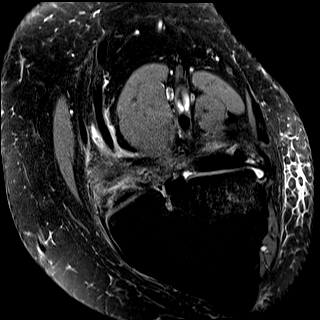
[im 7/21]
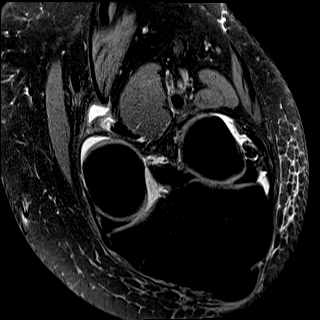
[im 14/21]
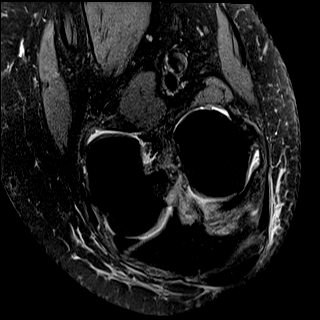
[im 21/21]
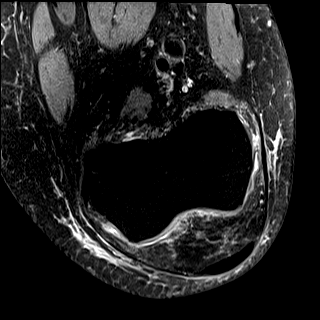

[40 of 40 positions shown; findings below may reference images not displayed]

FINDINGS: MENISCI

Medial meniscus: There is a longitudinal tear at the junction of the
posterior horn and body as seen on images 7 and 8 of series 8.

Lateral meniscus:  Intact.

LIGAMENTS

Cruciates:  The ACL is torn.  The PCL is intact.

Collaterals:  Intact.

CARTILAGE

Patellofemoral: There is some fraying and irregularity of hyaline
cartilage of the patella most notable in the midpole. No reactive
marrow signal change or full-thickness defect.

Medial:  Unremarkable.

Lateral:  Unremarkable.

Joint:  Small joint effusion.

Popliteal Fossa:  Very small Baker's cyst.

Extensor Mechanism:  Intact.

Bones: There is a bone contusion in the posterior aspect of the
lateral tibial plateau. A very small contusion is also seen in the
posterior aspect of the medial tibia.

Other: None.
IMPRESSION: Complete ACL tear.

Longitudinal tear in the periphery of the posterior horn of the
medial meniscus at the junction of the posterior horn and body.

Small bone contusions in the posterior tibia, greater on the lateral
side.

Chondromalacia patella.
